# Patient Record
Sex: Male | Born: 1945
Health system: Southern US, Community
[De-identification: ages and names within clinical notes are randomized; demographics above are authoritative.]

## PROBLEM LIST (undated history)

## (undated) DIAGNOSIS — D499 Neoplasm of unspecified behavior of unspecified site: Secondary | ICD-10-CM

## (undated) DIAGNOSIS — I38 Endocarditis, valve unspecified: Secondary | ICD-10-CM

## (undated) DIAGNOSIS — I4729 Other ventricular tachycardia: Secondary | ICD-10-CM

## (undated) DIAGNOSIS — I471 Supraventricular tachycardia, unspecified: Secondary | ICD-10-CM

## (undated) DIAGNOSIS — R0789 Other chest pain: Secondary | ICD-10-CM

## (undated) DIAGNOSIS — R519 Headache, unspecified: Secondary | ICD-10-CM

## (undated) DIAGNOSIS — R51 Headache: Secondary | ICD-10-CM

## (undated) DIAGNOSIS — Z8659 Personal history of other mental and behavioral disorders: Secondary | ICD-10-CM

## (undated) DIAGNOSIS — I10 Essential (primary) hypertension: Secondary | ICD-10-CM

## (undated) DIAGNOSIS — L570 Actinic keratosis: Secondary | ICD-10-CM

## (undated) DIAGNOSIS — R011 Cardiac murmur, unspecified: Secondary | ICD-10-CM

## (undated) DIAGNOSIS — R002 Palpitations: Secondary | ICD-10-CM

## (undated) DIAGNOSIS — I472 Ventricular tachycardia: Secondary | ICD-10-CM

## (undated) HISTORY — DX: Personal history of other mental and behavioral disorders: Z86.59

## (undated) HISTORY — DX: Endocarditis, valve unspecified: I38

## (undated) HISTORY — DX: Cardiac murmur, unspecified: R01.1

## (undated) HISTORY — DX: Actinic keratosis: L57.0

## (undated) HISTORY — DX: Other ventricular tachycardia: I47.29

## (undated) HISTORY — DX: Palpitations: R00.2

## (undated) HISTORY — DX: Neoplasm of unspecified behavior of unspecified site: D49.9

## (undated) HISTORY — PX: VASECTOMY: SHX75

## (undated) HISTORY — DX: Supraventricular tachycardia: I47.1

## (undated) HISTORY — DX: Other chest pain: R07.89

## (undated) HISTORY — DX: Supraventricular tachycardia, unspecified: I47.10

## (undated) HISTORY — DX: Ventricular tachycardia: I47.2

## (undated) HISTORY — PX: EYE SURGERY: SHX253

---

## 2011-11-10 ENCOUNTER — Encounter: Payer: Self-pay | Admitting: Cardiovascular Disease

## 2011-11-12 ENCOUNTER — Ambulatory Visit (INDEPENDENT_AMBULATORY_CARE_PROVIDER_SITE_OTHER): Payer: BC Managed Care – HMO | Admitting: Cardiovascular Disease

## 2011-11-12 ENCOUNTER — Encounter: Payer: Self-pay | Admitting: Cardiovascular Disease

## 2011-11-12 DIAGNOSIS — I499 Cardiac arrhythmia, unspecified: Secondary | ICD-10-CM

## 2011-11-12 DIAGNOSIS — R002 Palpitations: Secondary | ICD-10-CM

## 2011-11-12 DIAGNOSIS — R079 Chest pain, unspecified: Secondary | ICD-10-CM

## 2011-11-12 DIAGNOSIS — F411 Generalized anxiety disorder: Secondary | ICD-10-CM

## 2011-11-12 DIAGNOSIS — F419 Anxiety disorder, unspecified: Secondary | ICD-10-CM

## 2011-11-12 MED ORDER — METOPROLOL SUCCINATE ER 25 MG PO TB24: 25.0000 mg | ORAL_TABLET | Freq: Every day | ORAL | Status: DC

## 2011-11-12 NOTE — Progress Notes (Signed)
    Andrew [name] Date of Birth  01-09-1946 [name]     [place] Office  [address]    [address]   [address] [place], [place]  [address]    [place], [place]  [address] [phone]  Fax  [phone]  [phone]  Fax [phone]   History of Present Illness:  Andrew [name] is a 66 yo who we are asked to see for palpitations.  He also has episodes of anxiety.  He has occasional episodes of chest pain for years.  These pains lasts for a minute or so.  The CP is a dull pain. No radiation. No dyspnea or diaphoresis.    Always at rest - never with exertion.  No regular exercise.    Current Outpatient Prescriptions  Medication Sig Dispense Refill  . aspirin 325 MG tablet Take 325 mg by mouth every other day.      . Multiple Vitamin (MULTI-VITAMIN DAILY) TABS Take by mouth.      . Omega-3 Fatty Acids (FISH OIL) 1000 MG CAPS Take 1,000 mg by mouth 2 (two) times daily before a meal.      . saw palmetto 160 MG capsule Take 160 mg by mouth daily.      . vitamin D, CHOLECALCIFEROL, 400 UNITS tablet Take 400 Units by mouth daily.         No Known Allergies  History reviewed. No pertinent past medical history.  Past Surgical History  Procedure Date  . Vasectomy     History  Smoking status  . Never Smoker   Smokeless tobacco  . Not on file    History  Alcohol Use  . 1.1 oz/week  . 1 Drinks containing 0.5 oz of alcohol, 1 Glasses of wine per week    Family History  Problem Relation Age of Onset  . Heart attack Father     Reviw of Systems:  Reviewed in the HPI.  All other systems are negative.  Physical Exam: Blood pressure 144/85, pulse 98, height 6\' 1"  (1.854 m), weight 151 lb (68.493 kg). General: Well developed, well nourished, in no acute distress.  Head: Normocephalic, atraumatic, sclera non-icteric, mucus membranes are moist,   Neck: Supple. Negative for carotid bruits. JVD not elevated.  Lungs: Clear bilaterally to auscultation without wheezes,  rales, or rhonchi. Breathing is unlabored.  Heart: RRR with S1 S2. Tachycardic. His PMI is hyperdynamic. Has a soft 1/6 murmur at the axilla.  Abdomen: Soft, non-tender, non-distended with normoactive bowel sounds. No hepatomegaly. No rebound/guarding. No obvious abdominal masses.  Msk:  Strength and tone appear normal for age.  Extremities: No clubbing or cyanosis. No edema.  Distal pedal pulses are 2+ and equal bilaterally.  Neuro: Alert and oriented X 3. Moves all extremities spontaneously.  Psych:  Responds to questions appropriately with a normal affect.  ECG: NSR.  No ST or T wave abnormalities.  Cholesterol is 171. HDL is 55. Triglyceride level is 50. LDL is 106.  Creatinine 0.96. Electrolytes are normal.   TSH is 0.42.   Hemoglobin is 15.2. Hematocrit 42.2.  Assessment / Plan:

## 2011-11-12 NOTE — Patient Instructions (Signed)
Your physician has recommended you make the following change in your medication: Start Metoprolol XL 25mg  ONCE daily.  Your physician has requested that you have an echocardiogram. Echocardiography is a painless test that uses sound waves to create images of your heart. It provides your doctor with information about the size and shape of your heart and how well your heart's chambers and valves are working. This procedure takes approximately one hour. There are no restrictions for this procedure.  Your physician recommends that you schedule a follow-up appointment in: 1 month

## 2011-11-12 NOTE — Assessment & Plan Note (Signed)
And presents with palpitations. These episodes occur at various times and associated with some anxiety. He is PMI is quite hyperdynamic. He has a soft murmur consistent with mitral regurgitation.  We will start him on metoprolol XL 25 mg a day. His TSH is normal.  We'll get an echocardiogram for further evaluation of his left ventricular function and valvular function. I will see him again in approximately 3-4 weeks.

## 2011-11-30 ENCOUNTER — Other Ambulatory Visit: Payer: Self-pay

## 2011-11-30 ENCOUNTER — Other Ambulatory Visit (INDEPENDENT_AMBULATORY_CARE_PROVIDER_SITE_OTHER): Payer: BC Managed Care – HMO

## 2011-11-30 DIAGNOSIS — I059 Rheumatic mitral valve disease, unspecified: Secondary | ICD-10-CM

## 2011-11-30 DIAGNOSIS — I499 Cardiac arrhythmia, unspecified: Secondary | ICD-10-CM

## 2011-12-01 ENCOUNTER — Encounter: Payer: Self-pay | Admitting: *Deleted

## 2011-12-02 ENCOUNTER — Telehealth: Payer: Self-pay | Admitting: Cardiovascular Disease

## 2011-12-02 NOTE — Telephone Encounter (Signed)
Patient aware of echo results.

## 2011-12-02 NOTE — Telephone Encounter (Signed)
Fu call °Patient returning your call °

## 2011-12-06 ENCOUNTER — Encounter: Payer: Self-pay | Admitting: *Deleted

## 2011-12-07 ENCOUNTER — Ambulatory Visit (INDEPENDENT_AMBULATORY_CARE_PROVIDER_SITE_OTHER): Payer: BC Managed Care – HMO | Admitting: Cardiovascular Disease

## 2011-12-07 ENCOUNTER — Encounter: Payer: Self-pay | Admitting: Cardiovascular Disease

## 2011-12-07 VITALS — BP 112/68 | HR 87 | Ht 73.0 in | Wt 153.4 lb

## 2011-12-07 DIAGNOSIS — R002 Palpitations: Secondary | ICD-10-CM

## 2011-12-07 NOTE — Assessment & Plan Note (Signed)
Andrew [name]'s palpitations have clearly improved on metoprolol 25 mg XL per day. I've given him the okay to take 50 mg a day if he thinks that would help him.  Is not having any episodes of angina. I told him that we could do a stress test if he has any problems but at this point I think that he needs to start a regular exercise program. He'll see [place] again in 6 months.

## 2011-12-07 NOTE — Patient Instructions (Signed)
Your physician wants you to follow-up in: 6 MONTHS WITH DR. NAHSER IN THE [place] OFFICE. You will receive a reminder letter in the mail two months in advance. If you don't receive a letter, please call  our office to schedule the follow-up appointment.  NO CHANGES TODAY 

## 2011-12-07 NOTE — Progress Notes (Signed)
Andrew [name] Date of Birth  1946-10-11 [facility]     [place] Office  [address]    [address]   [address] [place], [place]  [address]    [place], [place]  [address] [phone]  Fax  [phone]  [phone]  Fax [phone]   History of Present Illness:  Andrew [name] is a 66 yo who we are asked to see for palpitations.  He also has episodes of anxiety.  He has occasional episodes of chest pain for years.  These pains lasts for a minute or so.  The CP is a dull pain. No radiation. No dyspnea or diaphoresis.    Always at rest - never with exertion.  No regular exercise.    We started Metoprolol and he feels better.  Mild DOE ( pulling trees out of the woods) , no angina.  He still notices some occasional palpitations and anxiety later at night. We discussed the possibility of increasing his metoprolol to 50 mg a day.  An echocardiogram reveals normal left ventricular systolic function. He has mild mitral regurgitation and mild tricuspid regurgitation.   Current Outpatient Prescriptions  Medication Sig Dispense Refill  . aspirin 325 MG tablet Take 325 mg by mouth every other day.      . metoprolol succinate (TOPROL XL) 25 MG 24 hr tablet Take 1 tablet (25 mg total) by mouth daily.  30 tablet  6  . Multiple Vitamin (MULTI-VITAMIN DAILY) TABS Take by mouth.      . Omega-3 Fatty Acids (FISH OIL) 1000 MG CAPS Take 1,000 mg by mouth 2 (two) times daily before a meal.      . saw palmetto 160 MG capsule Take 160 mg by mouth daily.      . vitamin D, CHOLECALCIFEROL, 400 UNITS tablet Take 400 Units by mouth daily.         No Known Allergies  Past Medical History  Diagnosis Date  . Chest pain     always at rest never with exertion  . Anxiety     hx  . Palpitations     hx    Past Surgical History  Procedure Date  . Vasectomy     History  Smoking status  . Never Smoker   Smokeless tobacco  . Not on file    History  Alcohol Use  . 1.1 oz/week  . 1  Glasses of wine, 1 Drinks containing 0.5 oz of alcohol per week    Family History  Problem Relation Age of Onset  . Heart attack Father     Reviw of Systems:  Reviewed in the HPI.  All other systems are negative.  Physical Exam: Blood pressure 112/68, pulse 87, height 6\' 1"  (1.854 m), weight 153 lb 6.4 oz (69.582 kg). General: Well developed, well nourished, in no acute distress.  Head: Normocephalic, atraumatic, sclera non-icteric, mucus membranes are moist,   Neck: Supple. Negative for carotid bruits. JVD not elevated.  Lungs: Clear bilaterally to auscultation without wheezes, rales, or rhonchi. Breathing is unlabored.  Heart: RRR with S1 S2. Tachycardic. His PMI is hyperdynamic. Has a soft 1/6 murmur at the axilla.  Abdomen: Soft, non-tender, non-distended with normoactive bowel sounds. No hepatomegaly. No rebound/guarding. No obvious abdominal masses.  Msk:  Strength and tone appear normal for age.  Extremities: No clubbing or cyanosis. No edema.  Distal pedal pulses are 2+ and equal bilaterally.  Neuro: Alert and oriented X 3. Moves all extremities spontaneously.  Psych:  Responds to questions appropriately with a normal affect.  ECG:  Cholesterol is 171. HDL is 55. Triglyceride level is 50. LDL is 106.  Creatinine 0.96. Electrolytes are normal.   TSH is 0.42.   Hemoglobin is 15.2. Hematocrit 42.2.  Assessment / Plan:

## 2011-12-24 ENCOUNTER — Telehealth: Payer: Self-pay | Admitting: Cardiovascular Disease

## 2011-12-24 NOTE — Telephone Encounter (Signed)
C/o un easyness and occasional skip beats, will take extra metoprolol 25 mg prn as discuss on visit. He is gong out of town and will need more meds if this helps, would you want him to have inderal 10 mg for prn use? Please advise, pt understands he will not have a call back till 3/18 or 3/19.

## 2011-12-24 NOTE — Telephone Encounter (Signed)
New msg Pt is taking 25 mg of metoprolol. He wants to know if ok to double up on this med. He needs another rx if Dr Elease Hashimoto says to double it. [facility] [place] Please call to discuss

## 2011-12-27 MED ORDER — PROPRANOLOL HCL 10 MG PO TABS: 10.0000 mg | ORAL_TABLET | Freq: Four times a day (QID) | ORAL | Status: DC

## 2011-12-27 NOTE — Telephone Encounter (Signed)
OK to try Inderal 10 mg as needed in addition to his metoprolol

## 2011-12-27 NOTE — Telephone Encounter (Signed)
Pt contacted,medication teaching done. Agreed to plan.

## 2012-01-05 ENCOUNTER — Telehealth: Payer: Self-pay | Admitting: Cardiovascular Disease

## 2012-01-05 NOTE — Telephone Encounter (Signed)
lmtcb

## 2012-01-05 NOTE — Telephone Encounter (Signed)
msg left/ no answer, i will call back.

## 2012-01-05 NOTE — Telephone Encounter (Signed)
Please return call to patient regarding medication instructions, he can be reached at [phone].

## 2012-01-06 NOTE — Telephone Encounter (Signed)
Pt rtn call form yesterday, left work number and not number put on message so he didn't get it until he got home, needs to be called at [phone], pls call

## 2012-01-06 NOTE — Telephone Encounter (Signed)
Pt continues to have palpitation problems, describes anxiety and fuzzy head feelings at times. Using propranolol and states he gets dizzy post. Bp is in 150/88 range, asked him to take bp and pulse when he feels that way and prior to taking propranolol and to keep record and bring to ov in [place], app set. Describes sore muscle feeling left chest as stated prior at ov. Told to call with further questions or concerns and pt agreed to plan.

## 2012-02-02 ENCOUNTER — Ambulatory Visit (INDEPENDENT_AMBULATORY_CARE_PROVIDER_SITE_OTHER): Payer: BC Managed Care – HMO | Admitting: Cardiovascular Disease

## 2012-02-02 ENCOUNTER — Other Ambulatory Visit: Payer: Self-pay | Admitting: *Deleted

## 2012-02-02 ENCOUNTER — Encounter: Payer: Self-pay | Admitting: Cardiovascular Disease

## 2012-02-02 VITALS — BP 110/68 | HR 68 | Ht 73.0 in | Wt 154.2 lb

## 2012-02-02 DIAGNOSIS — R002 Palpitations: Secondary | ICD-10-CM

## 2012-02-02 MED ORDER — METOPROLOL SUCCINATE ER 25 MG PO TB24: 25.0000 mg | ORAL_TABLET | Freq: Every day | ORAL | Status: DC

## 2012-02-02 NOTE — Patient Instructions (Signed)
Follow up with Dr. Elease Hashimoto in 4 months.

## 2012-02-02 NOTE — Progress Notes (Signed)
Andrew [name] Date of Birth  [birth year] [facility]     [place] Office  [address]    [address]   [address] [place], [place]  [address]    [place], [place]  [address] [phone]  Fax  [phone]  [phone]  Fax [phone]  Problem list: 1. Sinus tachycardia 2. Premature ventricular contractions 3. Mitral regurgitation - mild  History of Present Illness:  Andrew [name] is a 66 yo who we follow for palpitations. He had sinus tachycardia when I originally met him. He is done well on metoprolol XL.  We also gave him some propranolol for premature ventricular contractions. This caused him to have lightheadedness and also a "fuzzy feeling".  The ceiling quite a bit better. He's not having episodes of chest pain or shortness breath. He's able to do all of his normal activities including mowing the lawn with a push more without any difficulty.  He's had labs drawn by his general medical Dr. His TSH was found to be at the lower and of normal. He has a strong family history of thyroid problems.   Current Outpatient Prescriptions  Medication Sig Dispense Refill  . aspirin 325 MG tablet Take 325 mg by mouth every other day.      . metoprolol succinate (TOPROL XL) 25 MG 24 hr tablet Take 1 tablet (25 mg total) by mouth daily.  30 tablet  6  . Multiple Vitamin (MULTI-VITAMIN DAILY) TABS Take by mouth.      . Omega-3 Fatty Acids (FISH OIL) 1000 MG CAPS Take 1,000 mg by mouth 2 (two) times daily before a meal.      . saw palmetto 160 MG capsule Take 160 mg by mouth daily.      . vitamin D, CHOLECALCIFEROL, 400 UNITS tablet Take 1,000 Units by mouth daily.       . propranolol (INDERAL) 10 MG tablet Take 1 tablet (10 mg total) by mouth 4 (four) times daily. For palpitations, 30 minutes apart.  50 tablet  1     No Known Allergies  Past Medical History  Diagnosis Date  . Chest pain     always at rest never with exertion  . Anxiety     hx  . Palpitations     hx     Past Surgical History  Procedure Date  . Vasectomy     History  Smoking status  . Never Smoker   Smokeless tobacco  . Not on file    History  Alcohol Use  . 1.1 oz/week  . 1 Glasses of wine, 1 Drinks containing 0.5 oz of alcohol per week    Family History  Problem Relation Age of Onset  . Heart attack Father     Reviw of Systems:  Reviewed in the HPI.  All other systems are negative.  Physical Exam: Blood pressure 110/68, pulse 68, height 6\' 1"  (1.854 m), weight 154 lb 4 oz (69.967 kg). General: Well developed, well nourished, in no acute distress.  Head: Normocephalic, atraumatic, sclera non-icteric, mucus membranes are moist,   Neck: Supple. Negative for carotid bruits. JVD not elevated.  Lungs: Clear bilaterally to auscultation without wheezes, rales, or rhonchi. Breathing is unlabored.  Heart: RRR with S1 S2.  His PMI is hyperdynamic. Has a soft 1/6 murmur at the axilla.  Abdomen: Soft, non-tender, non-distended with normoactive bowel sounds. No hepatomegaly. No rebound/guarding. No obvious abdominal masses.  Msk:  Strength and tone appear normal for age.  Extremities: No  clubbing or cyanosis. No edema.  Distal pedal pulses are 2+ and equal bilaterally.  Neuro: Alert and oriented X 3. Moves all extremities spontaneously.  Psych:  Responds to questions appropriately with a normal affect.  ECG:  Labs from January, 2013 Cholesterol is 171. HDL is 55. Triglyceride level is 50. LDL is 106.  Creatinine 0.96. Electrolytes are normal.   TSH is 0.42.   Hemoglobin is 15.2. Hematocrit 42.2.  Assessment / Plan:

## 2012-02-02 NOTE — Assessment & Plan Note (Signed)
Andrew [name] is doing fairly well. At this point he seems to be doing quite well on the Toprol. He did not have a good response to the propranolol. We will have him stay off the propranolol for now. He'll keep his appointment in 4-5 months.

## 2012-06-28 ENCOUNTER — Ambulatory Visit (INDEPENDENT_AMBULATORY_CARE_PROVIDER_SITE_OTHER): Payer: BC Managed Care – HMO | Admitting: Cardiovascular Disease

## 2012-06-28 ENCOUNTER — Encounter: Payer: Self-pay | Admitting: Cardiovascular Disease

## 2012-06-28 VITALS — BP 132/78 | HR 80 | Ht 73.0 in | Wt 156.2 lb

## 2012-06-28 DIAGNOSIS — R002 Palpitations: Secondary | ICD-10-CM

## 2012-06-28 NOTE — Assessment & Plan Note (Signed)
[name] seems to be doing well.  His palpitations have been better since being on the Toprol XL.  I have encouraged him to walk on a regular basis.  Will see him again in 6 months.

## 2012-06-28 NOTE — Progress Notes (Signed)
Andrew [name] Date of Birth  Mar 04, 1946 [facility]      Office  [address]    [address]   [address] [place], [place]  [address]    [place], [place]  [address] [phone]  Fax  [phone]  [phone]  Fax [phone]  Problem list: 1. Sinus tachycardia 2. Premature ventricular contractions 3. Mitral regurgitation - mild  History of Present Illness:  Andrew [name] is a 66 yo who we follow for palpitations. He had sinus tachycardia when I originally met him. He is done well on metoprolol XL.  We also gave him some propranolol for premature ventricular contractions. This caused him to have lightheadedness and also a "fuzzy feeling".  His episodes of lightheadedness  significantly since he started drinking V8 juice every day or so.  He is feeling quite a bit better. He's not having episodes of chest pain or shortness breath. He's able to do all of his normal activities including mowing the lawn with a push more without any difficulty.  He has noted some mild chest discomfort but these are usually associated with mental stress situations.  He typically feels better when is active ( walking or mowing the grass)    Current Outpatient Prescriptions  Medication Sig Dispense Refill  . aspirin 325 MG tablet Take 325 mg by mouth every other day.      . cholecalciferol (VITAMIN D) 1000 UNITS tablet Take 1,000 Units by mouth daily.      . metoprolol succinate (TOPROL XL) 25 MG 24 hr tablet Take 1 tablet (25 mg total) by mouth daily.  90 tablet  3  . Multiple Vitamin (MULTI-VITAMIN DAILY) TABS Take by mouth.      . Omega-3 Fatty Acids (FISH OIL) 1000 MG CAPS Take 1,000 mg by mouth 2 (two) times daily before a meal.      . saw palmetto 160 MG capsule Take 160 mg by mouth daily.         No Known Allergies  Past Medical History  Diagnosis Date  . Chest pain     always at rest never with exertion  . Anxiety     hx  . Palpitations     hx    Past Surgical  History  Procedure Date  . Vasectomy     History  Smoking status  . Never Smoker   Smokeless tobacco  . Not on file    History  Alcohol Use  . 1.1 oz/week  . 1 Glasses of wine, 1 Drinks containing 0.5 oz of alcohol per week    Family History  Problem Relation Age of Onset  . Heart attack Father     Reviw of Systems:  Reviewed in the HPI.  All other systems are negative.  Physical Exam: Blood pressure 132/78, pulse 80, height 6\' 1"  (1.854 m), weight 156 lb 4 oz (70.875 kg). General: Well developed, well nourished, in no acute distress.  Head: Normocephalic, atraumatic, sclera non-icteric, mucus membranes are moist,   Neck: Supple. Negative for carotid bruits. JVD normal.  Lungs: Clear bilaterally to auscultation. Breathing is normal.  Heart: RRR with S1 S2.  His PMI is mildly  hyperdynamic. Has a soft 1/6 murmur at the axilla.  Abdomen: Soft, non-tender, non-distended with normoactive bowel sounds. No hepatomegaly. No rebound/guarding. No obvious abdominal masses.  Msk:  Strength and tone appear normal for age.  Extremities: No clubbing or cyanosis. No edema.  Distal pedal pulses are 2+ and equal bilaterally.  Neuro: Alert and oriented X 3. Moves all extremities spontaneously.  Psych:  Responds to questions appropriately with a normal affect.  ECG:  Sept. 17, 2013 - NSR at [employer] RBBB. LAE  Labs from January, 2013 Cholesterol is 171. HDL is 55. Triglyceride level is 50. LDL is 106.  Creatinine 0.96. Electrolytes are normal.  TSH is 0.42.   Hemoglobin is 15.2. Hematocrit 42.2.   Assessment / Plan:

## 2012-06-28 NOTE — Patient Instructions (Addendum)
Your physician wants you to follow-up in: 6 months with Dr. Nahser. You will receive a reminder letter in the mail two months in advance. If you don't receive a letter, please call our office to schedule the follow-up appointment.  

## 2012-11-25 ENCOUNTER — Other Ambulatory Visit: Payer: Self-pay

## 2012-12-26 ENCOUNTER — Encounter: Payer: Self-pay | Admitting: Cardiovascular Disease

## 2012-12-26 ENCOUNTER — Ambulatory Visit (INDEPENDENT_AMBULATORY_CARE_PROVIDER_SITE_OTHER): Payer: BC Managed Care – HMO | Admitting: Cardiovascular Disease

## 2012-12-26 VITALS — BP 130/72 | HR 84 | Ht 73.0 in | Wt 160.8 lb

## 2012-12-26 DIAGNOSIS — R002 Palpitations: Secondary | ICD-10-CM

## 2012-12-26 DIAGNOSIS — R079 Chest pain, unspecified: Secondary | ICD-10-CM

## 2012-12-26 NOTE — Progress Notes (Signed)
Philomena Course Date of Birth  1946/04/22 [facility]     [place] Office  [address]    [address]   [address] [place], [place]  [address]    [place], [place]  [address] [phone]  Fax  [phone]  [phone]  Fax [phone]  Problem list: 1. Sinus tachycardia 2. Premature ventricular contractions 3. 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 He had sinus tachycardia when I originally met him. He is done well on metoprolol XL.  We also gave him some propranolol for premature ventricular contractions. This caused him to have lightheadedness and also a "fuzzy feeling".  His episodes of lightheadedness  significantly since he started drinking V8 juice every day or so.  He is feeling quite a bit better. He's not having episodes of chest pain or shortness breath. He's able to do all of his normal activities including mowing the lawn with a push more without any difficulty.  He has noted some mild chest discomfort but these are usually associated with mental stress situations.  He typically feels better when is active ( walking or mowing the grass)  December 26, 2012: He is doing well. Tolerating the Toprol XL.    Current Outpatient Prescriptions  Medication Sig Dispense Refill  . aspirin 325 MG tablet Take 325 mg by mouth every other day.      . cholecalciferol (VITAMIN D) 1000 UNITS tablet Take 1,000 Units by mouth daily.      . metoprolol succinate (TOPROL XL) 25 MG 24 hr tablet Take 1 tablet (25 mg total) by mouth daily.  90 tablet  3  . Multiple Vitamin (MULTI-VITAMIN DAILY) TABS Take by mouth.      . Omega-3 Fatty Acids (FISH OIL) 1000 MG CAPS Take 1,000 mg by mouth 2 (two) times daily before a meal.      . saw palmetto 160 MG capsule Take 160 mg by mouth every other day.        No current facility-administered medications for this visit.     No Known Allergies  Past Medical History   Diagnosis Date  . Chest pain     always at rest never with exertion  . Anxiety     hx  . Palpitations     hx    Past Surgical History  Procedure Laterality Date  . Vasectomy      History  Smoking status  . Never Smoker   Smokeless tobacco  . Not on file    History  Alcohol Use  . 1.1 oz/week  . 1 Glasses of wine, 1 Drinks containing 0.5 oz of alcohol per week    Family History  Problem Relation Age of Onset  . Heart attack Father     Reviw of Systems:  Reviewed in the HPI.  All other systems are negative.  Physical Exam: Blood pressure 130/72, pulse 84, height 6\' 1"  (1.854 m), weight 160 lb 12 oz (72.916 kg). General: Well developed, well nourished, in no acute distress.  Head: Normocephalic, atraumatic, sclera non-icteric, mucus membranes are moist,   Neck: Supple. Negative for carotid bruits. JVD normal.  Lungs: Clear bilaterally to auscultation. Breathing is normal.  Heart: RRR with S1 S2.  His PMI is mildly  hyperdynamic. Has a soft 1/6 murmur at the axilla.  Abdomen: Soft, non-tender, non-distended with normoactive bowel sounds. No hepatomegaly. No rebound/guarding. No obvious abdominal masses.  Msk:  Strength and tone appear normal for age.  Extremities: No clubbing or cyanosis. No edema.  Distal pedal pulses are 2+ and equal bilaterally.  Neuro: Alert and oriented X 3. Moves all extremities spontaneously.  Psych:  Responds to questions appropriately with a normal affect.  ECG:  December 26, 2012:  [employer] RBBB. No ST or T wave changes.   Labs from January, 2013 Cholesterol is 171. HDL is 55. Triglyceride level is 50. LDL is 106.  Creatinine 0.96. Electrolytes are normal.  TSH is 0.42.   Hemoglobin is 15.2. Hematocrit 42.2.   Assessment / Plan:

## 2012-12-26 NOTE — Patient Instructions (Addendum)
Follow up with Dr. Elease Hashimoto in 6 months.  Continue with current medications. Call if you have any further questions or concerns.

## 2012-12-26 NOTE — Assessment & Plan Note (Signed)
[name] is doing well.  He is not having any significant angina.  He does have some very brief chest twinges in the evenings.  These are never associated with exertion.  They only last for a second.  I do not think they are due to coronary artery disease.   I will see him again in 6 months.  We will get fasting labs in 1 year.

## 2013-01-26 ENCOUNTER — Other Ambulatory Visit: Payer: Self-pay | Admitting: *Deleted

## 2013-01-26 MED ORDER — METOPROLOL SUCCINATE ER 25 MG PO TB24: 25.0000 mg | ORAL_TABLET | Freq: Every day | ORAL | Status: DC

## 2013-01-26 NOTE — Telephone Encounter (Signed)
Fax Received. Refill Completed. Andrew [name] (R.M.A)   

## 2013-04-27 ENCOUNTER — Telehealth: Payer: Self-pay | Admitting: *Deleted

## 2013-04-27 NOTE — Telephone Encounter (Signed)
Pt aware he does not need fasting lab work again until next year as per Dr. Elease Hashimoto office note in March Mylo Red RN

## 2013-04-27 NOTE — Telephone Encounter (Signed)
Needs bloodwork with his PCP is seeing Dr. Janece Canterbury in October is there any labs he would want done, please advise patient

## 2013-05-16 ENCOUNTER — Other Ambulatory Visit: Payer: Self-pay

## 2013-07-17 ENCOUNTER — Ambulatory Visit (INDEPENDENT_AMBULATORY_CARE_PROVIDER_SITE_OTHER): Payer: Medicare Other | Admitting: Cardiovascular Disease

## 2013-07-17 ENCOUNTER — Encounter: Payer: Self-pay | Admitting: Cardiovascular Disease

## 2013-07-17 VITALS — BP 120/86 | HR 85 | Ht 73.0 in | Wt 163.8 lb

## 2013-07-17 DIAGNOSIS — R002 Palpitations: Secondary | ICD-10-CM

## 2013-07-17 DIAGNOSIS — R079 Chest pain, unspecified: Secondary | ICD-10-CM

## 2013-07-17 DIAGNOSIS — R0789 Other chest pain: Secondary | ICD-10-CM | POA: Insufficient documentation

## 2013-07-17 NOTE — Progress Notes (Signed)
Andrew [name] Date of Birth  1946/02/22 [name]     [name]  [address]    [address]   [address] [name], [place]  [address]    [place], [place]  [address] [phone]  Fax  [phone]  [phone]  Fax [phone]  Problem list: 1. Sinus tachycardia 2. Premature ventricular contractions 3. 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 He had sinus tachycardia when I originally met him. He is done well on metoprolol XL.  We also gave him some propranolol for premature ventricular contractions. This caused him to have lightheadedness and also a "fuzzy feeling".  His episodes of lightheadedness  significantly since he started drinking V8 juice every day or so.  He is feeling quite a bit better. He's not having episodes of chest pain or shortness breath. He's able to do all of his normal activities including mowing the lawn with a push more without any difficulty.  He has noted some mild chest discomfort but these are usually associated with mental stress situations.  He typically feels better when is active ( walking or mowing the grass)  December 26, 2012: He is doing well. Tolerating the Toprol XL.  Oct. 7, 2014:  Andrew [name] is doing well.  He does have some chest pain.    The pains are the same that he has had everytime that he is here.  The pains are dull, random, not associated with exertion.   Similar to a muscle ache.     Current Outpatient Prescriptions  Medication Sig Dispense Refill  . aspirin 325 MG tablet Take 325 mg by mouth every other day.      . cholecalciferol (VITAMIN D) 1000 UNITS tablet Take 1,000 Units by mouth daily.      . metoprolol succinate (TOPROL XL) 25 MG 24 hr tablet Take 1 tablet (25 mg total) by mouth daily.  90 tablet  3  . Multiple Vitamin (MULTI-VITAMIN DAILY) TABS Take by mouth.      . Omega-3 Fatty Acids (FISH OIL) 1000 MG CAPS Take 1,000 mg by mouth 2  (two) times daily before a meal.      . saw palmetto 160 MG capsule Take 160 mg by mouth every other day.        No current facility-administered medications for this visit.     No Known Allergies  Past Medical History  Diagnosis Date  . Chest pain     always at rest never with exertion  . Anxiety     hx  . Palpitations     hx    Past Surgical History  Procedure Laterality Date  . Vasectomy      History  Smoking status  . Never Smoker   Smokeless tobacco  . Not on file    History  Alcohol Use  . 1.1 oz/week  . 1 Glasses of wine, 1 Drinks containing 0.5 oz of alcohol per week    Family History  Problem Relation Age of Onset  . Heart attack Father     Reviw of Systems:  Reviewed in the HPI.  All other systems are negative.  Physical Exam: Blood pressure 120/86, pulse 85, height 6\' 1"  (1.854 m), weight 163 lb 12 oz (74.277 kg). General: Well developed, well nourished, in no acute distress.  Head: Normocephalic, atraumatic, sclera non-icteric, mucus membranes are moist,   Neck: Supple. Negative for carotid  bruits. JVD normal.  Lungs: Clear bilaterally to auscultation. Breathing is normal.  Heart: RRR with S1 S2.  His PMI is mildly  hyperdynamic. Has a soft 1/6 murmur at the axilla.  Abdomen: Soft, non-tender, non-distended with normoactive bowel sounds. No hepatomegaly. No rebound/guarding. No obvious abdominal masses.  Msk:  Strength and tone appear normal for age.  Extremities: No clubbing or cyanosis. No edema.  Distal pedal pulses are 2+ and equal bilaterally.  Neuro: Alert and oriented X 3. Moves all extremities spontaneously.  Psych:  Responds to questions appropriately with a normal affect.  ECG:  December 26, 2012:  [employer] RBBB. No ST or T wave changes.   Labs from January, 2013 Cholesterol is 171. HDL is 55. Triglyceride level is 50. LDL is 106.  Creatinine 0.96. Electrolytes are normal.  TSH is 0.42.   Hemoglobin is 15.2. Hematocrit  42.2.   Assessment / Plan:

## 2013-07-17 NOTE — Assessment & Plan Note (Signed)
He is tolerating the metoprolol well.

## 2013-07-17 NOTE — Patient Instructions (Addendum)
Your physician wants you to follow-up in: 1 year. You will receive a reminder letter in the mail two months in advance. If you don't receive a letter, please call our office to schedule the follow-up appointment.  

## 2013-07-17 NOTE — Assessment & Plan Note (Signed)
He has continued to have atypical chest pain.  These pains typically occur at rest - never with exertion.  His resting ECG is normal.  He feels better after exercising.     We discussed doing a stress echo.  He is not interested in doing a stress echo at this time.  From his hx, I do not think these pains are from a cardiac etiology.

## 2013-08-01 DIAGNOSIS — D239 Other benign neoplasm of skin, unspecified: Secondary | ICD-10-CM | POA: Diagnosis not present

## 2013-08-01 DIAGNOSIS — L570 Actinic keratosis: Secondary | ICD-10-CM | POA: Diagnosis not present

## 2013-08-01 DIAGNOSIS — L821 Other seborrheic keratosis: Secondary | ICD-10-CM | POA: Diagnosis not present

## 2013-08-16 ENCOUNTER — Other Ambulatory Visit: Payer: Self-pay

## 2013-08-28 DIAGNOSIS — H43819 Vitreous degeneration, unspecified eye: Secondary | ICD-10-CM | POA: Diagnosis not present

## 2013-08-28 DIAGNOSIS — H2510 Age-related nuclear cataract, unspecified eye: Secondary | ICD-10-CM | POA: Diagnosis not present

## 2013-09-19 DIAGNOSIS — L821 Other seborrheic keratosis: Secondary | ICD-10-CM | POA: Diagnosis not present

## 2013-09-19 DIAGNOSIS — L570 Actinic keratosis: Secondary | ICD-10-CM | POA: Diagnosis not present

## 2014-01-17 ENCOUNTER — Other Ambulatory Visit: Payer: Self-pay | Admitting: Cardiovascular Disease

## 2014-03-14 DIAGNOSIS — T148 Other injury of unspecified body region: Secondary | ICD-10-CM | POA: Diagnosis not present

## 2014-03-14 DIAGNOSIS — W57XXXA Bitten or stung by nonvenomous insect and other nonvenomous arthropods, initial encounter: Secondary | ICD-10-CM | POA: Diagnosis not present

## 2014-03-14 DIAGNOSIS — L570 Actinic keratosis: Secondary | ICD-10-CM | POA: Diagnosis not present

## 2014-03-14 DIAGNOSIS — L821 Other seborrheic keratosis: Secondary | ICD-10-CM | POA: Diagnosis not present

## 2014-03-14 DIAGNOSIS — D485 Neoplasm of uncertain behavior of skin: Secondary | ICD-10-CM | POA: Diagnosis not present

## 2014-07-19 ENCOUNTER — Ambulatory Visit (INDEPENDENT_AMBULATORY_CARE_PROVIDER_SITE_OTHER): Payer: Medicare Other | Admitting: Cardiovascular Disease

## 2014-07-19 ENCOUNTER — Encounter: Payer: Self-pay | Admitting: Cardiovascular Disease

## 2014-07-19 VITALS — BP 130/70 | HR 86 | Ht 73.0 in | Wt 170.0 lb

## 2014-07-19 DIAGNOSIS — R0789 Other chest pain: Secondary | ICD-10-CM

## 2014-07-19 DIAGNOSIS — R002 Palpitations: Secondary | ICD-10-CM

## 2014-07-19 MED ORDER — ASPIRIN EC 81 MG PO TBEC: 81.0000 mg | DELAYED_RELEASE_TABLET | Freq: Every day | ORAL | Status: DC

## 2014-07-19 NOTE — Assessment & Plan Note (Signed)
Atypical Cp stABLE

## 2014-07-19 NOTE — Patient Instructions (Signed)
Please decrease your aspirin to 81 mg daily  Your physician wants you to follow-up in: 1 year.  You will receive a reminder letter in the mail two months in advance.  If you don't receive a letter, please call our office to schedule the follow-up appointment.  Your next appointment will be scheduled in our new office located at :  [place]  [address], [place]  [place], [place] [address]

## 2014-07-19 NOTE — Assessment & Plan Note (Signed)
We discussed his heart rate. His heart rate tends to run low but fast although he states it runs well at home. I suggested that we increase his Toprol-XL to 50 mg a day. He was quite resistant to this and wants to stay at  25 mg a day. We will have him keep the heart rate and blood pressure log and he will  bring it to his accident.

## 2014-07-19 NOTE — Progress Notes (Signed)
Andrew [name] Date of Birth  Apr 23, 1946 [place] [address]    [place]   [place], [place]  [address]    [place], [place]  [address] [phone]  Fax  [phone]  [phone]  Fax [phone]  Problem list: 1. Sinus tachycardia 2. Premature ventricular contractions 3. 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 He had sinus tachycardia when I originally met him. He is done well on metoprolol XL.  We also gave him some propranolol for premature ventricular contractions. This caused him to have lightheadedness and also a "fuzzy feeling".  His episodes of lightheadedness  significantly since he started drinking V8 juice every day or so.  He is feeling quite a bit better. He's not having episodes of chest pain or shortness breath. He's able to do all of his normal activities including mowing the lawn with a push more without any difficulty.  He has noted some mild chest discomfort but these are usually associated with mental stress situations.  He typically feels better when is active ( walking or mowing the grass)  December 26, 2012: He is doing well. Tolerating the Toprol XL.  Oct. 7, 2014:  [name] is doing well.  He does have some chest pain.    The pains are the same that he has had everytime that he is here.  The pains are dull, random, not associated with exertion.   Similar to a muscle ache.    Oct. 9, 2015:  [name] is doing well.  Was seen with his wife , Andrew [name].    BP and HR are stable Has started walking.   Still working on cars  - no changes in his chest twinges.     Current Outpatient Prescriptions  Medication Sig Dispense Refill  . aspirin 325 MG tablet Take 325 mg by mouth every other day.      . cholecalciferol (VITAMIN D) 1000 UNITS tablet Take 1,000 Units by mouth daily.      . metoprolol succinate (TOPROL-XL) 25 MG 24 hr tablet TAKE 1 TABLET BY  MOUTH EVERY DAY  90 tablet  3  . Multiple Vitamin (MULTI-VITAMIN DAILY) TABS Take by mouth.      . Omega-3 Fatty Acids (FISH OIL) 1000 MG CAPS Take 1,000 mg by mouth 2 (two) times daily before a meal.      . saw palmetto 160 MG capsule Take 160 mg by mouth every other day.        No current facility-administered medications for this visit.     No Known Allergies  Past Medical History  Diagnosis Date  . Chest pain     always at rest never with exertion  . Anxiety     hx  . Palpitations     hx    Past Surgical History  Procedure Laterality Date  . Vasectomy      History  Smoking status  . Never Smoker   Smokeless tobacco  . Not on file    History  Alcohol Use  . 1.1 oz/week  . 1 Glasses of wine, 1 Drinks containing 0.5 oz of alcohol per week    Family History  Problem Relation Age of Onset  . Heart attack Father     Reviw of Systems:  Reviewed in the HPI.  All other systems are negative.  Physical Exam: Blood pressure  130/70, pulse 86, height '6\' 1"'  (1.854 m), weight 170 lb (77.111 kg). General: Well developed, well nourished, in no acute distress.  Head: Normocephalic, atraumatic, sclera non-icteric, mucus membranes are moist,   Neck: Supple. Negative for carotid bruits. JVD normal.  Lungs: Clear bilaterally to auscultation. Breathing is normal.  Heart: RRR with S1 S2.  His PMI is mildly  hyperdynamic. Has a soft 1/6 murmur at the axilla.  Abdomen: Soft, non-tender, non-distended with normoactive bowel sounds. No hepatomegaly. No rebound/guarding. No obvious abdominal masses.  Msk:  Strength and tone appear normal for age.  Extremities: No clubbing or cyanosis. No edema.  Distal pedal pulses are 2+ and equal bilaterally.  Neuro: Alert and oriented X 3. Moves all extremities spontaneously.  Psych:  Responds to questions appropriately with a normal affect.  ECG: 07/19/2014: Normal sinus rhythm at 81. He has no ST or T wave changes.  Assessment /  Plan:

## 2014-07-25 ENCOUNTER — Encounter: Payer: Self-pay | Admitting: Cardiovascular Disease

## 2014-09-16 DIAGNOSIS — L578 Other skin changes due to chronic exposure to nonionizing radiation: Secondary | ICD-10-CM | POA: Diagnosis not present

## 2014-09-16 DIAGNOSIS — L219 Seborrheic dermatitis, unspecified: Secondary | ICD-10-CM | POA: Diagnosis not present

## 2014-09-16 DIAGNOSIS — L814 Other melanin hyperpigmentation: Secondary | ICD-10-CM | POA: Diagnosis not present

## 2014-09-16 DIAGNOSIS — L820 Inflamed seborrheic keratosis: Secondary | ICD-10-CM | POA: Diagnosis not present

## 2014-09-16 DIAGNOSIS — Z1283 Encounter for screening for malignant neoplasm of skin: Secondary | ICD-10-CM | POA: Diagnosis not present

## 2014-09-16 DIAGNOSIS — L570 Actinic keratosis: Secondary | ICD-10-CM | POA: Diagnosis not present

## 2014-09-16 DIAGNOSIS — D229 Melanocytic nevi, unspecified: Secondary | ICD-10-CM | POA: Diagnosis not present

## 2014-09-16 DIAGNOSIS — L821 Other seborrheic keratosis: Secondary | ICD-10-CM | POA: Diagnosis not present

## 2015-01-01 ENCOUNTER — Other Ambulatory Visit: Payer: Self-pay | Admitting: Cardiovascular Disease

## 2015-03-24 DIAGNOSIS — L570 Actinic keratosis: Secondary | ICD-10-CM | POA: Diagnosis not present

## 2015-03-24 DIAGNOSIS — D229 Melanocytic nevi, unspecified: Secondary | ICD-10-CM | POA: Diagnosis not present

## 2015-03-24 DIAGNOSIS — L821 Other seborrheic keratosis: Secondary | ICD-10-CM | POA: Diagnosis not present

## 2015-03-24 DIAGNOSIS — L820 Inflamed seborrheic keratosis: Secondary | ICD-10-CM | POA: Diagnosis not present

## 2015-07-21 ENCOUNTER — Encounter: Payer: Self-pay | Admitting: Cardiovascular Disease

## 2015-07-21 ENCOUNTER — Ambulatory Visit (INDEPENDENT_AMBULATORY_CARE_PROVIDER_SITE_OTHER): Payer: Medicare Other | Admitting: Cardiovascular Disease

## 2015-07-21 VITALS — BP 120/76 | HR 77 | Ht 73.0 in | Wt 171.5 lb

## 2015-07-21 DIAGNOSIS — R002 Palpitations: Secondary | ICD-10-CM

## 2015-07-21 DIAGNOSIS — R0789 Other chest pain: Secondary | ICD-10-CM

## 2015-07-21 NOTE — Progress Notes (Signed)
Andrew [name] Date of Birth  12-08-1945 [facility] [address]    [place]   [place], [place]  [address]    [place], [place]  [address] [phone]  Fax  [phone]  [phone]  Fax [phone]  Problem list: 1. Sinus tachycardia 2. Premature ventricular contractions 3. 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 He had sinus tachycardia when I originally met him. He is done well on metoprolol XL.  We also gave him some propranolol for premature ventricular contractions. This caused him to have lightheadedness and also a "fuzzy feeling".  His episodes of lightheadedness  significantly since he started drinking V8 juice every day or so.  He is feeling quite a bit better. He's not having episodes of chest pain or shortness breath. He's able to do all of his normal activities including mowing the lawn with a push more without any difficulty.  He has noted some mild chest discomfort but these are usually associated with mental stress situations.  He typically feels better when is active ( walking or mowing the grass)  December 26, 2012: He is doing well. Tolerating the Toprol XL.  Oct. 7, 2014:  Andrew [name] is doing well.  He does have some chest pain.    The pains are the same that he has had everytime that he is here.  The pains are dull, random, not associated with exertion.   Similar to a muscle ache.    Oct. 9, 2015:  Andrew [name] is doing well.  Was seen with his wife , Andrew [name].    BP and HR are stable Has started walking.   Still working on cars  - no changes in his chest twinges.  Oct. 10, 2016:  Andrew [name] is doing well.  Still occasional CP .  Walks on occasion . Perhaps not as much as before Looking forward to walking more during this fall weather.  Goes kayaking frequently .    Current Outpatient Prescriptions  Medication Sig Dispense Refill  . aspirin EC 81 MG tablet  Take 1 tablet (81 mg total) by mouth daily. 90 tablet 3  . cholecalciferol (VITAMIN D) 1000 UNITS tablet Take 1,000 Units by mouth daily.    . magnesium oxide (MAG-OX) 400 MG tablet Take 400 mg by mouth every other day.    . metoprolol succinate (TOPROL-XL) 25 MG 24 hr tablet TAKE 1 TABLET BY MOUTH EVERY DAY 90 tablet 3  . Multiple Vitamin (MULTI-VITAMIN DAILY) TABS Take by mouth.    . saw palmetto 160 MG capsule Take 160 mg by mouth every other day.      No current facility-administered medications for this visit.     No Known Allergies  Past Medical History  Diagnosis Date  . Chest pain     always at rest never with exertion  . Anxiety     hx  . Palpitations     hx    Past Surgical History  Procedure Laterality Date  . Vasectomy      History  Smoking status  . Never Smoker   Smokeless tobacco  . Not on file    History  Alcohol Use  . 1.1 oz/week  . 1 Glasses of wine, 1 Standard drinks or equivalent per week    Family History  Problem Relation Age of Onset  . Heart attack Father  Reviw of Systems:  Reviewed in the HPI.  All other systems are negative.  Physical Exam: Blood pressure 120/76, pulse 77, height _0  (1.854 m), weight 77.792 kg (171 lb 8 oz). General: Well developed, well nourished, in no acute distress.  Head: Normocephalic, atraumatic, sclera non-icteric, mucus membranes are moist,   Neck: Supple. Negative for carotid bruits. JVD normal.  Lungs: Clear bilaterally to auscultation. Breathing is normal.  Heart: RRR with S1 S2.  His PMI is mildly  hyperdynamic. Has a soft 1/6 murmur at the axilla.  Abdomen: Soft, non-tender, non-distended with normoactive bowel sounds. No hepatomegaly. No rebound/guarding. No obvious abdominal masses.  Msk:  Strength and tone appear normal for age.  Extremities: No clubbing or cyanosis. No edema.  Distal pedal pulses are 2+ and equal bilaterally.  Neuro: Alert and oriented X 3. Moves all extremities  spontaneously.  Psych:  Responds to questions appropriately with a normal affect.  ECG: 07/21/2015: Normal sinus rhythm at 77. EKG is normal.  Assessment / Plan:   1. Sinus tachycardia - better , tolerating the Toprol XL quite well.   2. Premature ventricular contractions 3. Mitral regurgitation - mild  Follow up in 1 year with Dr. Dorene Ar here in [place] or with me in [place].    Nahser, Wonda Cheng, MD  07/21/2015 10:08 AM    [place],  [place], [place]  [address] Pager [phone] Phone: [phone]; Fax: [phone]   [facility]  [address] [place],   [address] [phone]   Fax [phone]

## 2015-07-21 NOTE — Patient Instructions (Signed)
Medication Instructions:  Your physician recommends that you continue on your current medications as directed. Please refer to the Current Medication list given to you today.   Labwork: none  Testing/Procedures: none  Follow-Up: Your physician wants you to follow-up in: one year with Dr. Nahser. You will receive a reminder letter in the mail two months in advance. If you don't receive a letter, please call our office to schedule the follow-up appointment.   Any Other Special Instructions Will Be Listed Below (If Applicable).   

## 2015-08-11 DIAGNOSIS — D485 Neoplasm of uncertain behavior of skin: Secondary | ICD-10-CM | POA: Diagnosis not present

## 2015-08-11 DIAGNOSIS — C44319 Basal cell carcinoma of skin of other parts of face: Secondary | ICD-10-CM | POA: Diagnosis not present

## 2015-08-11 DIAGNOSIS — C4491 Basal cell carcinoma of skin, unspecified: Secondary | ICD-10-CM

## 2015-08-11 DIAGNOSIS — L578 Other skin changes due to chronic exposure to nonionizing radiation: Secondary | ICD-10-CM | POA: Diagnosis not present

## 2015-08-11 HISTORY — DX: Basal cell carcinoma of skin, unspecified: C44.91

## 2015-09-02 DIAGNOSIS — C44319 Basal cell carcinoma of skin of other parts of face: Secondary | ICD-10-CM | POA: Diagnosis not present

## 2015-11-18 DIAGNOSIS — D485 Neoplasm of uncertain behavior of skin: Secondary | ICD-10-CM | POA: Diagnosis not present

## 2015-11-18 DIAGNOSIS — L570 Actinic keratosis: Secondary | ICD-10-CM | POA: Diagnosis not present

## 2015-11-18 DIAGNOSIS — Z1283 Encounter for screening for malignant neoplasm of skin: Secondary | ICD-10-CM | POA: Diagnosis not present

## 2015-11-18 DIAGNOSIS — L814 Other melanin hyperpigmentation: Secondary | ICD-10-CM | POA: Diagnosis not present

## 2015-11-18 DIAGNOSIS — L820 Inflamed seborrheic keratosis: Secondary | ICD-10-CM | POA: Diagnosis not present

## 2015-11-18 DIAGNOSIS — L821 Other seborrheic keratosis: Secondary | ICD-10-CM | POA: Diagnosis not present

## 2015-11-18 DIAGNOSIS — Z85828 Personal history of other malignant neoplasm of skin: Secondary | ICD-10-CM | POA: Diagnosis not present

## 2015-11-18 DIAGNOSIS — L83 Acanthosis nigricans: Secondary | ICD-10-CM | POA: Diagnosis not present

## 2015-11-18 DIAGNOSIS — D229 Melanocytic nevi, unspecified: Secondary | ICD-10-CM | POA: Diagnosis not present

## 2015-12-14 ENCOUNTER — Emergency Department
Admission: EM | Admit: 2015-12-14 | Discharge: 2015-12-14 | Disposition: A | Discharge status: Home / Self Care | Payer: Medicare Other | Attending: Emergency Medicine | Admitting: Emergency Medicine

## 2015-12-14 ENCOUNTER — Emergency Department: Payer: Medicare Other

## 2015-12-14 ENCOUNTER — Encounter: Payer: Self-pay | Admitting: *Deleted

## 2015-12-14 DIAGNOSIS — R05 Cough: Secondary | ICD-10-CM | POA: Diagnosis not present

## 2015-12-14 DIAGNOSIS — R509 Fever, unspecified: Secondary | ICD-10-CM | POA: Diagnosis not present

## 2015-12-14 DIAGNOSIS — J069 Acute upper respiratory infection, unspecified: Secondary | ICD-10-CM | POA: Insufficient documentation

## 2015-12-14 DIAGNOSIS — Z7982 Long term (current) use of aspirin: Secondary | ICD-10-CM | POA: Diagnosis not present

## 2015-12-14 DIAGNOSIS — Z79899 Other long term (current) drug therapy: Secondary | ICD-10-CM | POA: Diagnosis not present

## 2015-12-14 DIAGNOSIS — B349 Viral infection, unspecified: Secondary | ICD-10-CM | POA: Diagnosis not present

## 2015-12-14 DIAGNOSIS — B9789 Other viral agents as the cause of diseases classified elsewhere: Secondary | ICD-10-CM

## 2015-12-14 DIAGNOSIS — J988 Other specified respiratory disorders: Secondary | ICD-10-CM

## 2015-12-14 MED ORDER — ALBUTEROL SULFATE HFA 108 (90 BASE) MCG/ACT IN AERS: 2.0000 | INHALATION_SPRAY | Freq: Four times a day (QID) | RESPIRATORY_TRACT | Status: DC | PRN

## 2015-12-14 MED ORDER — ALBUTEROL SULFATE HFA 108 (90 BASE) MCG/ACT IN AERS
1.0000 | INHALATION_SPRAY | Freq: Four times a day (QID) | RESPIRATORY_TRACT | Status: DC
  Filled 2015-12-14: qty 6.7

## 2015-12-14 NOTE — ED Notes (Addendum)
Pt reports cough for 1 week.  Cough worse today with fever.  Nonproductive cough. Pt took 2 asa at [employer].  Pt alert.  Speech clear.

## 2015-12-14 NOTE — ED Provider Notes (Signed)
CSN: [id number]     Arrival date & time 12/14/15  2029 History   First MD Initiated Contact with Patient 12/14/15 2226     Chief Complaint  Patient presents with  . Cough     (Consider location/radiation/quality/duration/timing/severity/associated sxs/prior Treatment) HPI  70 year old male presents to [facility] for evaluation of cough. Cough isn't present for 4 days. He's had mild headache with nasal congestion. Today developed temperature of 101.3 at 7:30 PM, took aspirin, temperature down to 99.9. Patient denies any chest pain, shortness of breath, abdominal pain, nausea or vomiting. Wife was concerned that she heard crackles when he was breathing. Patient has no history of heart failure, asthma or COPD. He is tolerating fluids well.   Past Medical History  Diagnosis Date  . Chest pain     always at rest never with exertion  . Anxiety     hx  . Palpitations     hx   Past Surgical History  Procedure Laterality Date  . Vasectomy     Family History  Problem Relation Age of Onset  . Heart attack Father    Social History  Substance Use Topics  . Smoking status: Never Smoker   . Smokeless tobacco: None  . Alcohol Use: 1.1 oz/week    1 Glasses of wine, 1 Standard drinks or equivalent per week    Review of Systems  Constitutional: Negative.  Negative for fever, chills, activity change and appetite change.  HENT: Negative for congestion, ear pain, mouth sores, rhinorrhea, sinus pressure, sore throat and trouble swallowing.   Eyes: Negative for photophobia, pain and discharge.  Respiratory: Positive for cough and wheezing. Negative for chest tightness and shortness of breath.   Cardiovascular: Negative for chest pain and leg swelling.  Gastrointestinal: Negative for nausea, vomiting, abdominal pain, diarrhea and abdominal distention.  Genitourinary: Negative for dysuria and difficulty urinating.  Musculoskeletal: Negative for back pain, arthralgias and gait problem.  Skin:  Negative for color change and rash.  Neurological: Negative for dizziness and headaches.  Hematological: Negative for adenopathy.  Psychiatric/Behavioral: Negative for behavioral problems and agitation.      Allergies  Review of patient's allergies indicates no known allergies.  Home Medications   Prior to Admission medications   Medication Sig Start Date End Date Taking? Authorizing Provider  albuterol (PROVENTIL HFA;VENTOLIN HFA) 108 (90 Base) MCG/ACT inhaler Inhale 2 puffs into the lungs every 6 (six) hours as needed for wheezing or shortness of breath. 12/14/15   Duanne Guess, PA-C  aspirin EC 81 MG tablet Take 1 tablet (81 mg total) by mouth daily. 07/19/14   Thayer Headings, MD  cholecalciferol (VITAMIN D) 1000 UNITS tablet Take 1,000 Units by mouth daily.    Historical Provider, MD  magnesium oxide (MAG-OX) 400 MG tablet Take 400 mg by mouth every other day.    Historical Provider, MD  metoprolol succinate (TOPROL-XL) 25 MG 24 hr tablet TAKE 1 TABLET BY MOUTH EVERY DAY 01/01/15   Thayer Headings, MD  Multiple Vitamin (MULTI-VITAMIN DAILY) TABS Take by mouth.    Historical Provider, MD  saw palmetto 160 MG capsule Take 160 mg by mouth every other day.     Historical Provider, MD   BP 148/78 mmHg  Pulse 97  Temp(Src) 99.9 F (37.7 C) (Oral)  Resp 20  Ht 6\' 1"  (1.854 m)  Wt 75.297 kg  BMI 21.91 kg/m2  SpO2 96% Physical Exam  Constitutional: He is oriented to person, place, and time. He appears  well-developed and well-nourished.  HENT:  Head: Normocephalic and atraumatic.  Eyes: Conjunctivae and EOM are normal. Pupils are equal, round, and reactive to light.  Neck: Normal range of motion. Neck supple.  Cardiovascular: Normal rate, regular rhythm, normal heart sounds and intact distal pulses.   Pulmonary/Chest: Effort normal. No respiratory distress. He has wheezes (slight expiratory wheeze). He has no rales. He exhibits no tenderness.  Abdominal: Soft. Bowel sounds are  normal. He exhibits no distension and no mass. There is no tenderness. There is no guarding.  Musculoskeletal: Normal range of motion. He exhibits no edema or tenderness.  Lymphadenopathy:    He has cervical adenopathy (posterior cervical).  Neurological: He is alert and oriented to person, place, and time.  Skin: Skin is warm and dry. No rash noted.  Psychiatric: He has a normal mood and affect. His behavior is normal. Judgment and thought content normal.    ED Course  Procedures (including critical care time) Labs Review Labs Reviewed  CBC  COMPREHENSIVE METABOLIC PANEL    Imaging Review Dg Chest 2 View  12/14/2015  CLINICAL DATA:  Nonproductive cough and fever for 1 week. EXAM: CHEST  2 VIEW COMPARISON:  None. FINDINGS: The heart size and mediastinal contours are within normal limits. Both lungs are clear. Small hiatal hernia noted. No evidence of pneumothorax or pleural effusion. IMPRESSION: No active cardiopulmonary disease.  Small hiatal hernia. Electronically Signed   By: Earle Gell M.D.   On: 12/14/2015 21:58   I have personally reviewed and evaluated these images and lab results as part of my medical decision-making.   EKG Interpretation None      MDM   Final diagnoses:  Viral respiratory illness    70 year old male with upper respiratory infection, present for 4 days. Chest x-ray negative. Fever down to 99.9. Mild expiratory wheezing on exam, patient given a prescription for albuterol inhaler. Educated on red flags to return to the ER for. Call for any worsening symptoms urgent changes in health.  Duanne Guess, PA-C 12/14/15 [place], MD 12/14/15 2250

## 2015-12-14 NOTE — Discharge Instructions (Signed)
Viral Infections °A viral infection can be caused by different types of viruses. Most viral infections are not serious and resolve on their own. However, some infections may cause severe symptoms and may lead to further complications. °SYMPTOMS °Viruses can frequently cause: °· Minor sore throat. °· Aches and pains. °· Headaches. °· Runny nose. °· Different types of rashes. °· Watery eyes. °· Tiredness. °· Cough. °· Loss of appetite. °· Gastrointestinal infections, resulting in nausea, vomiting, and diarrhea. °These symptoms do not respond to antibiotics because the infection is not caused by bacteria. However, you might catch a bacterial infection following the viral infection. This is sometimes called a "superinfection." Symptoms of such a bacterial infection may include: °· Worsening sore throat with pus and difficulty swallowing. °· Swollen neck glands. °· Chills and a high or persistent fever. °· Severe headache. °· Tenderness over the sinuses. °· Persistent overall ill feeling (malaise), muscle aches, and tiredness (fatigue). °· Persistent cough. °· Yellow, green, or brown mucus production with coughing. °HOME CARE INSTRUCTIONS  °· Only take over-the-counter or prescription medicines for pain, discomfort, diarrhea, or fever as directed by your caregiver. °· Drink enough water and fluids to keep your urine clear or pale yellow. Sports drinks can provide valuable electrolytes, sugars, and hydration. °· Get plenty of rest and maintain proper nutrition. Soups and broths with crackers or rice are fine. °SEEK IMMEDIATE MEDICAL CARE IF:  °· You have severe headaches, shortness of breath, chest pain, neck pain, or an unusual rash. °· You have uncontrolled vomiting, diarrhea, or you are unable to keep down fluids. °· You or your child has an oral temperature above 102° F (38.9° C), not controlled by medicine. °· Your baby is older than 3 months with a rectal temperature of 102° F (38.9° C) or higher. °· Your baby is 3  months old or younger with a rectal temperature of 100.4° F (38° C) or higher. °MAKE SURE YOU:  °· Understand these instructions. °· Will watch your condition. °· Will get help right away if you are not doing well or get worse. °  °This information is not intended to replace advice given to you by your health care provider. Make sure you discuss any questions you have with your health care provider. °  °Document Released: 07/07/2005 Document Revised: 12/20/2011 Document Reviewed: 03/05/2015 °Elsevier Interactive Patient Education ©2016 [employer] Inc[employer] ° °

## 2015-12-14 NOTE — ED Notes (Signed)
NAD noted at time of D/C. Pt denies questions or concerns. Pt ambulatory to the lobby at this time.  

## 2015-12-31 ENCOUNTER — Other Ambulatory Visit: Payer: Self-pay | Admitting: Cardiovascular Disease

## 2016-04-26 ENCOUNTER — Other Ambulatory Visit: Payer: Self-pay | Admitting: *Deleted

## 2016-04-26 MED ORDER — METOPROLOL SUCCINATE ER 25 MG PO TB24: 25.0000 mg | ORAL_TABLET | Freq: Every day | ORAL | Status: DC

## 2016-04-26 NOTE — Telephone Encounter (Signed)
Requested Prescriptions   Signed Prescriptions Disp Refills  . metoprolol succinate (TOPROL-XL) 25 MG 24 hr tablet 90 tablet 3    Sig: Take 1 tablet (25 mg total) by mouth daily.    Authorizing Provider: Thayer Headings    Ordering User: Britt Bottom

## 2016-04-28 ENCOUNTER — Other Ambulatory Visit: Payer: Self-pay | Admitting: *Deleted

## 2016-04-28 MED ORDER — METOPROLOL SUCCINATE ER 25 MG PO TB24
25.0000 mg | ORAL_TABLET | Freq: Every day | ORAL | Status: DC
Start: 2016-04-28 — End: 2016-07-14

## 2016-04-28 NOTE — Telephone Encounter (Signed)
Requested Prescriptions   Signed Prescriptions Disp Refills  . metoprolol succinate (TOPROL-XL) 25 MG 24 hr tablet 90 tablet 0    Sig: Take 1 tablet (25 mg total) by mouth daily.    Authorizing Provider: Thayer Headings    Ordering User: Britt Bottom

## 2016-07-02 DIAGNOSIS — L821 Other seborrheic keratosis: Secondary | ICD-10-CM | POA: Diagnosis not present

## 2016-07-02 DIAGNOSIS — Z85828 Personal history of other malignant neoplasm of skin: Secondary | ICD-10-CM | POA: Diagnosis not present

## 2016-07-02 DIAGNOSIS — L820 Inflamed seborrheic keratosis: Secondary | ICD-10-CM | POA: Diagnosis not present

## 2016-07-06 NOTE — Progress Notes (Signed)
Cardiology Office Note   Date:  07/14/2016   ID:  Andrew [name], DOB [birth year], MRN [id number]  Referring Doctor:  Suzan Garibaldi, FNP   Cardiologist:   Wende Bushy, MD   Reason for consultation:  Chief Complaint  Patient presents with  . Follow-up    some cp here and there. no swelling or sob. no other complaints.      History of Present Illness: Andrew [name] is a 70 y.o. male who presents for Follow up.  Patient reports episodes of chest pain. He's had this for several years. The center of the chest sometimes left or right side of the chest. Doubt sensation, mild in intensity, nonradiating, mostly at rest. Nonexertional. He does not think it is related to reflux. Different than a sensation of palpitation.  Patient tries to remain physically active. He can walk 2 miles onto some biking without any chest pain or shortness of breath.  He believes that the metoprolol has up with the palpitations.  Patient denies PND, orthopnea, edema. No shortness of breath. No lightheadedness or passing out.   ROS:  Please see the history of present illness. Aside from mentioned under HPI, all other systems are reviewed and negative.     Past Medical History:  Diagnosis Date  . Anxiety    hx  . Chest pain    always at rest never with exertion  . Palpitations    hx    Past Surgical History:  Procedure Laterality Date  . VASECTOMY       reports that he has never smoked. He does not have any smokeless tobacco history on file. He reports that he drinks about 1.1 oz of alcohol per week . He reports that he does not use drugs.   family history includes Heart attack in his father.   Outpatient Medications Prior to Visit  Medication Sig Dispense Refill  . aspirin EC 81 MG tablet Take 1 tablet (81 mg total) by mouth daily. 90 tablet 3  . cholecalciferol (VITAMIN D) 1000 UNITS tablet Take 1,000 Units by mouth daily.    . metoprolol succinate (TOPROL-XL) 25 MG 24 hr tablet Take  1 tablet (25 mg total) by mouth daily. 90 tablet 0  . Multiple Vitamin (MULTI-VITAMIN DAILY) TABS Take by mouth.    . saw palmetto 160 MG capsule Take 160 mg by mouth every other day.     . albuterol (PROVENTIL HFA;VENTOLIN HFA) 108 (90 Base) MCG/ACT inhaler Inhale 2 puffs into the lungs every 6 (six) hours as needed for wheezing or shortness of breath. (Patient not taking: Reported on 07/14/2016) 1 Inhaler 2  . magnesium oxide (MAG-OX) 400 MG tablet Take 400 mg by mouth every other day.     No facility-administered medications prior to visit.      Allergies: Review of patient's allergies indicates no known allergies.    PHYSICAL EXAM: VS:  BP 112/70 (BP Location: Left Arm, Patient Position: Sitting, Cuff Size: Normal)   Pulse 69   Ht 6\' 1"  (1.854 m)   Wt 163 lb 12.8 oz (74.3 kg)   BMI 21.61 kg/m  , Body mass index is 21.61 kg/m. Wt Readings from Last 3 Encounters:  07/14/16 163 lb 12.8 oz (74.3 kg)  12/14/15 166 lb (75.3 kg)  07/21/15 171 lb 8 oz (77.8 kg)    GENERAL:  well developed, well nourished, not in acute distress HEENT: normocephalic, pink conjunctivae, anicteric sclerae, no xanthelasma, normal dentition, oropharynx clear NECK:  no  neck vein engorgement, JVP normal, no hepatojugular reflux, carotid upstroke brisk and symmetric, no bruit, no thyromegaly, no lymphadenopathy LUNGS:  good respiratory effort, clear to auscultation bilaterally CV:  PMI not displaced, no thrills, no lifts, S1 and S2 within normal limits, no palpable S3 or S4, no murmurs, no rubs, no gallops ABD:  Soft, nontender, nondistended, normoactive bowel sounds, no abdominal aortic bruit, no hepatomegaly, no splenomegaly MS: nontender back, no kyphosis, no scoliosis, no joint deformities EXT:  2+ DP/PT pulses, no edema, no varicosities, no cyanosis, no clubbing SKIN: warm, nondiaphoretic, normal turgor, no ulcers NEUROPSYCH: alert, oriented to person, place, and time, sensory/motor grossly intact, normal  mood, appropriate affect  Recent Labs: No results found for requested labs within last 8760 hours.   Lipid Panel No results found for: CHOL, TRIG, HDL, CHOLHDL, VLDL, LDLCALC, LDLDIRECT   Other studies Reviewed:  EKG:  The ekg from 07/14/2016 was personally reviewed by me and it revealed sinus rhythm, 69 BPM  Additional studies/ records that were reviewed personally reviewed by me today include:  Echo 11/30/2011: Left ventricle: The cavity size was normal. There was mild focal basal hypertrophy of the septum. Systolic function was normal. The estimated ejection fraction was in the range of 55% to 60%. Wall motion was normal; there were no regional wall motion abnormalities. Left ventricular diastolic function parameters were normal. - Mitral valve: Mild regurgitation. - Left atrium: The atrium was normal in size. - Pulmonary arteries: Systolic pressure was within the normal range. Impressions: - Murmur could be secondarty to proximal septal thickening. No [name] or significant outflow tract obstruction.  ASSESSMENT AND PLAN: Sinus tachycardia  PVCs History of sinus tachycardia. Heart rate is improved. Continue with metoprolol XL.   Mitral regurgitation - mild No appreciable murmur. Continue to monitor.  Chest pain Per history, appears atypical. However, he does not feel this is related to reflux or 2 sensation of palpitation. He does not believe it is related to position or musculoskeletal issues. There has been going on for years, patient has not had a stress this in the past. For patient reassurance, recommend treadmill test, more to rule out CAD.  Current medicines are reviewed at length with the patient today.  The patient does not have concerns regarding medicines.  Labs/ tests ordered today include: No orders of the defined types were placed in this encounter.   I had a lengthy and detailed discussion with the patient regarding diagnoses, prognosis,  diagnostic options, treatment options , and side effects of medications.   I counseled the patient on importance of lifestyle modification including heart healthy diet, regular physical activity    Disposition:   FU with undersigned after tests/in one year  I spent at least 40 minutes with the patient today and more than 50% of the time was spent counseling the patient and coordinating care.     Signed, Wende Bushy, MD  07/14/2016 10:08 AM    [place]  This note was generated in part with voice recognition software and I apologize for any typographical errors that were not detected and corrected.

## 2016-07-14 ENCOUNTER — Encounter: Payer: Self-pay | Admitting: Cardiology

## 2016-07-14 ENCOUNTER — Ambulatory Visit (INDEPENDENT_AMBULATORY_CARE_PROVIDER_SITE_OTHER): Payer: Medicare Other | Admitting: Cardiology

## 2016-07-14 VITALS — BP 112/70 | HR 69 | Ht 73.0 in | Wt 163.8 lb

## 2016-07-14 DIAGNOSIS — I493 Ventricular premature depolarization: Secondary | ICD-10-CM

## 2016-07-14 DIAGNOSIS — R0789 Other chest pain: Secondary | ICD-10-CM

## 2016-07-14 MED ORDER — METOPROLOL SUCCINATE ER 25 MG PO TB24: 25.0000 mg | ORAL_TABLET | Freq: Every day | ORAL | 3 refills | Status: DC

## 2016-07-14 NOTE — Patient Instructions (Signed)
Medication Instructions:  Refill of metoprolol sent in to [facility] mail order pharmacy.   Testing/Procedures: Your physician has requested that you have an exercise tolerance test. For further information please visit [email or web address]. Please also follow instruction sheet, as given.   Do not drink or eat foods with caffeine for 24 hours before the test.  If you use an inhaler, bring it with you to the test.  Do not smoke for 4 hours before the test.  Wear comfortable shoes and clothing.   Follow-Up: Your physician wants you to follow-up in: 1 year with Dr. Yvone Neu. You will receive a reminder letter in the mail two months in advance. If you don't receive a letter, please call our office to schedule the follow-up appointment.  It was a pleasure seeing you today here in the office. Please do not hesitate to give [place] a call back if you have any further questions. [place], BSN      Exercise Stress Electrocardiogram An exercise stress electrocardiogram is a test to check how blood flows to your heart. It is done to find areas of poor blood flow. You will need to walk on a treadmill for this test. The electrocardiogram will record your heartbeat when you are at rest and when you are exercising. BEFORE THE PROCEDURE  Do not have drinks with caffeine or foods with caffeine for 24 hours before the test, or as told by your doctor. This includes coffee, tea (even decaf tea), sodas, chocolate, and cocoa.  Follow your doctor's instructions about eating and drinking before the test.  Ask your doctor what medicines you should or should not take before the test. Take your medicines with water unless told by your doctor not to.  If you use an inhaler, bring it with you to the test.  Bring a snack to eat after the test.  Do not  smoke for 4 hours before the test.  Do not put lotions, powders, creams, or oils on your chest before the test.  Wear comfortable shoes and  clothing. PROCEDURE  You will have patches put on your chest. Small areas of your chest may need to be shaved. Wires will be connected to the patches.  Your heart rate will be watched while you are resting and while you are exercising.  You will walk on the treadmill. The treadmill will slowly get faster to raise your heart rate.  The test will take about 1-2 hours. AFTER THE PROCEDURE  Your heart rate and blood pressure will be watched after the test.  You may return to your normal diet, activities, and medicines or as told by your doctor.   This information is not intended to replace advice given to you by your health care provider. Make sure you discuss any questions you have with your health care provider.   Document Released: 03/15/2008 Document Revised: 10/18/2014 Document Reviewed: 06/04/2013 Elsevier Interactive Patient Education [employer].

## 2016-08-13 ENCOUNTER — Ambulatory Visit (INDEPENDENT_AMBULATORY_CARE_PROVIDER_SITE_OTHER): Payer: Medicare Other

## 2016-08-13 DIAGNOSIS — I493 Ventricular premature depolarization: Secondary | ICD-10-CM

## 2016-08-13 DIAGNOSIS — R0789 Other chest pain: Secondary | ICD-10-CM

## 2016-08-13 LAB — EXERCISE TOLERANCE TEST
CHL CUP MPHR: 152 {beats}/min
CHL CUP RESTING HR STRESS: 99 {beats}/min
CSEPEDS: 8 s
Estimated workload: 5.9 METS
Exercise duration (min): 4 min
Peak HR: 137 {beats}/min
Percent HR: 90 %

## 2016-11-23 DIAGNOSIS — Z1283 Encounter for screening for malignant neoplasm of skin: Secondary | ICD-10-CM | POA: Diagnosis not present

## 2016-11-23 DIAGNOSIS — D229 Melanocytic nevi, unspecified: Secondary | ICD-10-CM | POA: Diagnosis not present

## 2016-11-23 DIAGNOSIS — D1800 Hemangioma unspecified site: Secondary | ICD-10-CM | POA: Diagnosis not present

## 2016-11-23 DIAGNOSIS — L570 Actinic keratosis: Secondary | ICD-10-CM | POA: Diagnosis not present

## 2016-11-23 DIAGNOSIS — I788 Other diseases of capillaries: Secondary | ICD-10-CM | POA: Diagnosis not present

## 2016-11-23 DIAGNOSIS — L821 Other seborrheic keratosis: Secondary | ICD-10-CM | POA: Diagnosis not present

## 2016-11-23 DIAGNOSIS — Z85828 Personal history of other malignant neoplasm of skin: Secondary | ICD-10-CM | POA: Diagnosis not present

## 2016-11-23 DIAGNOSIS — L814 Other melanin hyperpigmentation: Secondary | ICD-10-CM | POA: Diagnosis not present

## 2017-02-11 ENCOUNTER — Telehealth: Payer: Self-pay | Admitting: Cardiology

## 2017-02-11 NOTE — Telephone Encounter (Signed)
Patient states that he was bending down to clean up something in his shop and he developed a "stronger pounding heart beat" and then he went to mow the yard on a embankment and then started having shortness of breath. He was concerned and wanted to review. He states that he is going on a flight to [place] and will be gone for a week. He denied any chest pain with this episode and states that he just had that pounding sensation with bending down and then the shortness of breath with mowing. Instructed him to monitor his symptoms, blood pressure readings, heart rate, and if symptoms persisted or worsened to seek care at emergency department. Set him up to come in on 02/23/17 to see Dr. Yvone Neu. He verbalized understanding of our conversation, agreement with plan, and had no further questions at this time.

## 2017-02-11 NOTE — Telephone Encounter (Signed)
Pt wife states he was mowing and came inside with "pounding heart" and elevated BP 190/bottom was "normal" HR was "high", not sure the #. States his BP now 110/60 HR 110. States during the episode he was SOB, is currently not SOB.

## 2017-02-23 ENCOUNTER — Ambulatory Visit (INDEPENDENT_AMBULATORY_CARE_PROVIDER_SITE_OTHER): Payer: Medicare Other

## 2017-02-23 ENCOUNTER — Ambulatory Visit (INDEPENDENT_AMBULATORY_CARE_PROVIDER_SITE_OTHER): Payer: Medicare Other | Admitting: Cardiology

## 2017-02-23 ENCOUNTER — Encounter: Payer: Self-pay | Admitting: Cardiology

## 2017-02-23 VITALS — BP 110/68 | HR 75 | Ht 73.0 in | Wt 165.8 lb

## 2017-02-23 DIAGNOSIS — R002 Palpitations: Secondary | ICD-10-CM | POA: Diagnosis not present

## 2017-02-23 DIAGNOSIS — R0789 Other chest pain: Secondary | ICD-10-CM | POA: Diagnosis not present

## 2017-02-23 NOTE — Patient Instructions (Signed)
Labwork: Your physician recommends that you return for lab work. Make sure not to eat or drink after midnight prior to coming in for these to be done. Go to the [facility] of [facility] and check in at the front desk with lab slips. No appointment is needed for this.    Testing/Procedures: Your physician has recommended that you wear an Zio monitor. Zio monitors are medical devices that record the heart's electrical activity. Doctors most often [place] these monitors to diagnose arrhythmias. Arrhythmias are problems with the speed or rhythm of the heartbeat. The monitor is a small, portable device. You can wear one while you do your normal daily activities. This is usually used to diagnose what is causing palpitations/syncope (passing out).    Follow-Up: Your physician wants you to follow-up in: 6 months. You will receive a reminder letter in the mail two months in advance. If you don't receive a letter, please call our office to schedule the follow-up appointment.  It was a pleasure seeing you today here in the office. Please do not hesitate to give [place] a call back if you have any further questions. [place], BSN     Cardiac Event Monitoring A cardiac event monitor is a small recording device that is used to detect abnormal heart rhythms (arrhythmias). The monitor is used to record your heart rhythm when you have symptoms, such as:  Fast heartbeats (palpitations), such as heart racing or fluttering.  Dizziness.  Fainting or light-headedness.  Unexplained weakness. Some monitors are wired to electrodes placed on your chest. Electrodes are flat, sticky disks that attach to your skin. Other monitors may be hand-held or worn on the wrist. The monitor can be worn for up to 30 days. If the monitor is attached to your chest, a technician will prepare your chest for the electrode placement and show you how to work the monitor. Take time to practice using the monitor before you  leave the office. Make sure you understand how to send the information from the monitor to your health care provider. In some cases, you may need to use a landline telephone instead of a cell phone. What are the risks? Generally, this device is safe to use, but it possible that the skin under the electrodes will become irritated. How to use your cardiac event monitor  Wear your monitor at all times, except when you are in water:  Do not let the monitor get wet.  Take the monitor off when you bathe. Do not swim or use a hot tub with it on.  Keep your skin clean. Do not put body lotion or moisturizer on your chest.  Change the electrodes as told by your health care provider or any time they stop sticking to your skin. You may need to use medical tape to keep them on.  Try to put the electrodes in slightly different places on your chest to help prevent skin irritation. They must remain in the area under your left breast and in the upper right section of your chest.  Make sure the monitor is safely clipped to your clothing or in a location close to your body that your health care provider recommends.  Press the button to record as soon as you feel heart-related symptoms, such as:  Dizziness.  Weakness.  Light-headedness.  Palpitations.  Thumping or pounding in your chest.  Shortness of breath.  Unexplained weakness.  Keep a diary of your activities, such as walking, doing chores,  and taking medicine. It is very important to note what you were doing when you pushed the button to record your symptoms. This will help your health care provider determine what might be contributing to your symptoms.  Send the recorded information as recommended by your health care provider. It may take some time for your health care provider to process the results.  Change the batteries as told by your health care provider.  Keep electronic devices away from your monitor. This  includes:  Tablets.  MP3 players.  Cell phones.  While wearing your monitor you should avoid:  Electric blankets.  Armed forces operational officer.  Electric toothbrushes.  Microwave ovens.  Magnets.  Metal detectors. Get help right away if:  You have chest pain.  You have extreme difficulty breathing or shortness of breath.  You develop a very fast heartbeat that persists.  You develop dizziness that does not go away.  You faint or constantly feel like you are about to faint. Summary  A cardiac event monitor is a small recording device that is used to help detect abnormal heart rhythms (arrhythmias).  The monitor is used to record your heart rhythm when you have heart-related symptoms.  Make sure you understand how to send the information from the monitor to your health care provider.  It is important to press the button on the monitor when you have any heart-related symptoms.  Keep a diary of your activities, such as walking, doing chores, and taking medicine. It is very important to note what you were doing when you pushed the button to record your symptoms. This will help your health care provider learn what might be causing your symptoms. This information is not intended to replace advice given to you by your health care provider. Make sure you discuss any questions you have with your health care provider. Document Released: 07/06/2008 Document Revised: 09/11/2016 Document Reviewed: 09/11/2016 Elsevier Interactive Patient Education  2017 [employer] Profile Test Why am I having this test? The lipid profile test gives results that can help predict the likelihood of developing heart disease. The test is also used to monitor treatment for high cholesterol to see if you are reaching your goals. A lipid profile measures the following:  Total cholesterol. Cholesterol is a waxy fat in your blood. If your total cholesterol is elevated, this can increase your risk of coronary  heart disease.  High-density lipoprotein (HDL). This is known as the good cholesterol. Having a high level of HDL is good. Your HDL level may be low if you smoke or do not get enough exercise.  Low-density lipoprotein (LDL). This is known as the bad cholesterol and is responsible for the formation of plaque in the arteries. Having a low level of LDL is best.  Cholesterol to HDL ratio. This is calculated by dividing the total cholesterol by the HDL cholesterol. The ratio is used by health care providers for determining your risk of heart disease. A low ratio is best.  Triglycerides. These are a type of fat in the blood responsible for providing energy to your cells. Low levels are best. What kind of sample is taken? A blood sample is required for this test. It is usually collected by inserting a needle into a vein. How do I prepare for this test? Do not eat or drink anything after midnight on the night before the test or as directed by your health care provider. What are the reference ranges? Reference ranges are considered healthy ranges established after  testing a large group of healthy people. Reference ranges may vary among different people, labs, and hospitals. It is your responsibility to obtain your test results. Ask the lab or department performing the test when and how you will get your results. Reference ranges for the lipid profile test are as follows: Total Cholesterol   Adult or elderly: less than 200 mg/dL or less than 5.20 mmol/L (SI units).  Child: 120-200 mg/dL.  Infant: 70-175 mg/dL.  Newborns: 53-135 mg/dL. HDL   Male: greater than 45 mg/dL or greater than 0.75 mmol/L (SI units).  Male: greater than 55 mg/dL or greater than 0.91 mmol/L (SI units). HDL reference values based on risk of heart disease:  For low risk of heart disease:  Male: 60 mg/dL or 1.55 mmol/L.  Male: 70 mg/dL or 1.81 mmol/L.  For moderate risk of heart disease:  Male: 45 mg/dL or 1.17  mmol/L.  Male: 55 mg/dL or 1.42 mmol/L.  For high risk of heart disease:  Male: 25 mg/dL or 0.65 mmol/L.  Male: 35 mg/dL or 0.90 mmol/L. LDL   Adult: less than 130 mg/dL.  Children: less than 110 mg/dL. Cholesterol to HDL Ratio  Reference values based on risk for coronary heart disease:  Risk that is one half average:  Male: 3.4.  Male: 3.3.  Average risk:  Male: 5.0.  Male: 4.4.  Risk that is two times average (moderate risk):  Male: 10.0.  Male: 7.0.  Risk that is three times average (high risk):    Male: 11.0. Triglycerides   Adult or elderly:  Male: 40-160 mg/dL or 0.45-1.81 mmol/L (SI units).  Male: 35-135 mg/dL or 0.40-1.52 mmol/L (SI units).  Children 53-68 years old:  Male: 30-86 mg/dL.  Male: 32-99 mg/dL.  Children 101-55 years old:  Male: 31-108 mg/dL.  Male: 35-114 mg/dL.  Children 11-53 years old:  Male: 36-138 mg/dL.  Male: 41-138 mg/dL.  Children 35-75 years old:  Male: 40-163 mg/dL.  Male: 40-128 mg/dL. Triglycerides should be less than 400 mg/dL even when you are not fasting. What do the results mean? Talk with your health care provider to discuss your results, treatment options, and if necessary, the need for more tests. Talk with your health care provider if you have any questions about your results. Talk with your health care provider to discuss your results, treatment options, and if necessary, the need for more tests. Talk with your health care provider if you have any questions about your results. This information is not intended to replace advice given to you by your health care provider. Make sure you discuss any questions you have with your health care provider. Document Released: 10/21/2004 Document Revised: 06/02/2016 Document Reviewed: 01/17/2014 Elsevier Interactive Patient Education  2017 [employer].

## 2017-02-23 NOTE — Progress Notes (Signed)
Cardiology Office Note   Date:  02/23/2017   ID:  Andrew [name], DOB November 29, 1945, MRN [id number]  Referring Doctor:  Suzan Garibaldi, FNP   Cardiologist:   Wende Bushy, MD   Reason for consultation:  Chief Complaint  Patient presents with  . other    Patient c/o SOB and heart pounding. Meds reviewed verbally with patient.       History of Present Illness: Andrew [name] is a 71 y.o. male who presents for Follow up for palpitations  Patient describes an episode roughly 2 weeks ago. He had sudden onset of pounding, fluttering in the chest. This happened when he bent forward and stood back up. This has never happened to him before,  severity was intense, symptoms lasted several minutes, symptoms in the chest. Despite having the episode of fluttering, he decided to finish mowing the lawn (at work) and was short of breath doing that. He then drove home. He checked his blood pressure, it recorded heart rates in the 140s, blood pressure was elevated. Second time that he checked, the heart rate was not so fast and blood pressure was not so high. Eventually, his symptoms subsided and spontaneously resolved. He has not had any more symptoms since then.   He continues to have occasional palpitations and mild chest pain, mostly when resting. None with exertion or physical activity. Since that episode of severe palpitations and pounding, he is gone back to mowing the lawn and doing his regular staff without any chest pain or shortness of breath.  Patient denies PND, orthopnea, edema.   ROS:  Please see the history of present illness. Aside from mentioned under HPI, all other systems are reviewed and negative.    Past Medical History:  Diagnosis Date  . Anxiety    hx  . Chest pain    always at rest never with exertion  . Palpitations    hx    Past Surgical History:  Procedure Laterality Date  . VASECTOMY       reports that he has never smoked. He has never used smokeless tobacco.  He reports that he drinks about 1.1 oz of alcohol per week . He reports that he does not use drugs.   family history includes Heart attack in his father.   Outpatient Medications Prior to Visit  Medication Sig Dispense Refill  . aspirin EC 81 MG tablet Take 1 tablet (81 mg total) by mouth daily. 90 tablet 3  . cholecalciferol (VITAMIN D) 1000 UNITS tablet Take 1,000 Units by mouth daily.    . metoprolol succinate (TOPROL-XL) 25 MG 24 hr tablet Take 1 tablet (25 mg total) by mouth daily. 90 tablet 3  . Multiple Vitamin (MULTI-VITAMIN DAILY) TABS Take by mouth.    . saw palmetto 160 MG capsule Take 160 mg by mouth every other day.     . albuterol (PROVENTIL HFA;VENTOLIN HFA) 108 (90 Base) MCG/ACT inhaler Inhale 2 puffs into the lungs every 6 (six) hours as needed for wheezing or shortness of breath. (Patient not taking: Reported on 07/14/2016) 1 Inhaler 2  . magnesium oxide (MAG-OX) 400 MG tablet Take 400 mg by mouth every other day.     No facility-administered medications prior to visit.      Allergies: Patient has no known allergies.    PHYSICAL EXAM: VS:  BP 110/68 (BP Location: Left Arm, Patient Position: Sitting, Cuff Size: Normal)   Pulse 75   Ht '6\' 1"'  (1.854 m)   Wt  165 lb 12 oz (75.2 kg)   BMI 21.87 kg/m  , Body mass index is 21.87 kg/m. Wt Readings from Last 3 Encounters:  02/23/17 165 lb 12 oz (75.2 kg)  07/14/16 163 lb 12.8 oz (74.3 kg)  12/14/15 166 lb (75.3 kg)    PHYSICAL EXAM: VS:  BP 110/68 (BP Location: Left Arm, Patient Position: Sitting, Cuff Size: Normal)   Pulse 75   Ht '6\' 1"'  (1.854 m)   Wt 165 lb 12 oz (75.2 kg)   BMI 21.87 kg/m  , BMI Body mass index is 21.87 kg/m. GENERAL:  well developed, well nourished, not in acute distress HEENT: normocephalic, pink conjunctivae, anicteric sclerae, no xanthelasma, normal dentition, oropharynx clear NECK:  no neck vein engorgement, JVP normal, no hepatojugular reflux, carotid upstroke brisk and symmetric, no bruit,  no thyromegaly, no lymphadenopathy LUNGS:  good respiratory effort, clear to auscultation bilaterally CV:  PMI not displaced, no thrills, no lifts, S1 and S2 within normal limits, no palpable S3 or S4, no murmurs, no rubs, no gallops ABD:  Soft, nontender, nondistended, normoactive bowel sounds, no abdominal aortic bruit, no hepatomegaly, no splenomegaly MS: nontender back, no kyphosis, no scoliosis, no joint deformities EXT:  2+ DP/PT pulses, no edema, no varicosities, no cyanosis, no clubbing SKIN: warm, nondiaphoretic, normal turgor, no ulcers NEUROPSYCH: alert, oriented to person, place, and time, sensory/motor grossly intact, normal mood, appropriate affect    Recent Labs: No results found for requested labs within last 8760 hours.   Lipid Panel No results found for: CHOL, TRIG, HDL, CHOLHDL, VLDL, LDLCALC, LDLDIRECT   Other studies Reviewed:  EKG:  The ekg from 07/14/2016 was personally reviewed by me and it revealed sinus rhythm, 69 BPM  Additional studies/ records that were reviewed personally reviewed by me today include:  Echo 11/30/2011: Left ventricle: The cavity size was normal. There was mild focal basal hypertrophy of the septum. Systolic function was normal. The estimated ejection fraction was in the range of 55% to 60%. Wall motion was normal; there were no regional wall motion abnormalities. Left ventricular diastolic function parameters were normal. - Mitral valve: Mild regurgitation. - Left atrium: The atrium was normal in size. - Pulmonary arteries: Systolic pressure was within the normal range. Impressions: - Murmur could be secondarty to proximal septal thickening. No [name] or significant outflow tract obstruction.  Treadmill stress test 08/13/2016: Exercise stress test Resting EKG showing normal sinus rhythm, rate 97 bpm, no significant ST or T-wave changes Resting blood pressure 139/92 Regular Bruce protocol performed Total exercise time  4 minutes, target heart rate achieved 137 bpm, 90% of maximum predicted heart rate 5.9 METS PVCs noted No significant ST or T-wave changes at peak exercise or in recovery concerning for ischemia Heart rate down to 98 bpm at 3 minutes in recovery  ASSESSMENT AND PLAN: Sinus tachycardia  PVCs History of sinus tachycardia. Resting heart rate stable on metoprolol XL Recent episode is something new, possibly SVT or atrial fibrillation. We had a lengthy discussion about importance of doing a monitor. We will order a two-week monitor for now, may need to repeat another one if we do not detect anything on this one. Patient is also advised to call 911 if he happens to have another episode similar to the one to weeks ago. Patient verbalized understanding  Mitral regurgitation - mild No appreciable murmur. Continue to monitor.  Chest pain As per his last visit, this chest pain is atypical, nonexertional. Advised to follow-up with PCP to have  this further worked up. he underwent a treadmill stress test for this particular reason the first visit. That showed no evidence of ischemia but poor functional capacity. Likely deconditioning. He continues to have similar atypical non-exertional chest pain. Again advised to follow-up with PCP.  Patient is requesting to get some blood work done since he has not been to his PCP. We'll order CBC, CMP, TSH free T4, fasting lipid panel.  Current medicines are reviewed at length with the patient today.  The patient does not have concerns regarding medicines.  Labs/ tests ordered today include:  Orders Placed This Encounter  Procedures  . CBC with Differential/Platelet  . Comp Met (CMET)  . TSH  . T4, free  . Lipid panel  . LONG TERM MONITOR (3-14 DAYS)  . EKG 12-Lead    I had a lengthy and detailed discussion with the patient regarding diagnoses, prognosis, diagnostic options.   Disposition:   FU with Cardiology in 6 months or sooner  I spent at  least 40 minutes with the patient today and more than 50% of the time was spent counseling the patient and coordinating care.      Signed, Wende Bushy, MD  02/23/2017 5:13 PM    [place]  This note was generated in part with voice recognition software and I apologize for any typographical errors that were not detected and corrected.

## 2017-02-24 ENCOUNTER — Other Ambulatory Visit
Admission: RE | Admit: 2017-02-24 | Discharge: 2017-02-24 | Disposition: A | Discharge status: Home / Self Care | Payer: Medicare Other | Source: Ambulatory Visit | Attending: Cardiology | Admitting: Cardiology

## 2017-02-24 ENCOUNTER — Telehealth: Payer: Self-pay | Admitting: *Deleted

## 2017-02-24 DIAGNOSIS — R002 Palpitations: Secondary | ICD-10-CM | POA: Insufficient documentation

## 2017-02-24 DIAGNOSIS — R0789 Other chest pain: Secondary | ICD-10-CM

## 2017-02-24 LAB — COMPREHENSIVE METABOLIC PANEL
ALBUMIN: 4.2 g/dL (ref 3.5–5.0)
ALT: 14 U/L — AB (ref 17–63)
AST: 17 U/L (ref 15–41)
Alkaline Phosphatase: 58 U/L (ref 38–126)
Anion gap: 4 — ABNORMAL LOW (ref 5–15)
BUN: 16 mg/dL (ref 6–20)
CHLORIDE: 105 mmol/L (ref 101–111)
CO2: 29 mmol/L (ref 22–32)
CREATININE: 0.92 mg/dL (ref 0.61–1.24)
Calcium: 9 mg/dL (ref 8.9–10.3)
GFR calc Af Amer: >60 mL/min (ref >60)
GFR calc non Af Amer: >60 mL/min (ref >60)
GLUCOSE: 98 mg/dL (ref 65–99)
Potassium: 4.5 mmol/L (ref 3.5–5.1)
SODIUM: 138 mmol/L (ref 135–145)
Total Bilirubin: 1 mg/dL (ref 0.3–1.2)
Total Protein: 7.2 g/dL (ref 6.5–8.1)

## 2017-02-24 LAB — CBC WITH DIFFERENTIAL/PLATELET
BASOS ABS: 0 10*3/uL (ref 0–0.1)
Basophils Relative: 1 %
EOS ABS: 0.1 10*3/uL (ref 0–0.7)
EOS PCT: 3 %
HCT: 41.3 % (ref 40.0–52.0)
HEMOGLOBIN: 14.1 g/dL (ref 13.0–18.0)
Lymphocytes Relative: 29 %
Lymphs Abs: 1.2 10*3/uL (ref 1.0–3.6)
MCH: 31.3 pg (ref 26.0–34.0)
MCHC: 34.1 g/dL (ref 32.0–36.0)
MCV: 91.8 fL (ref 80.0–100.0)
Monocytes Absolute: 0.4 10*3/uL (ref 0.2–1.0)
Monocytes Relative: 9 %
NEUTROS PCT: 58 %
Neutro Abs: 2.4 10*3/uL (ref 1.4–6.5)
PLATELETS: 121 10*3/uL — AB (ref 150–440)
RBC: 4.49 MIL/uL (ref 4.40–5.90)
RDW: 12.5 % (ref 11.5–14.5)
WBC: 4.1 10*3/uL (ref 3.8–10.6)

## 2017-02-24 LAB — T4, FREE: FREE T4: 1.06 ng/dL (ref 0.61–1.12)

## 2017-02-24 LAB — LIPID PANEL
CHOLESTEROL: 184 mg/dL (ref 0–200)
HDL: 47 mg/dL (ref >40)
LDL Cholesterol: 127 mg/dL — ABNORMAL HIGH (ref 0–99)
TRIGLYCERIDES: 48 mg/dL (ref <150)
Total CHOL/HDL Ratio: 3.9 RATIO
VLDL: 10 mg/dL (ref 0–40)

## 2017-02-24 LAB — TSH: TSH: 0.286 u[IU]/mL — ABNORMAL LOW (ref 0.350–4.500)

## 2017-02-24 NOTE — Telephone Encounter (Signed)
Spoke with patients wife per release form and reviewed all lab results and recommendations. She verbalized understanding with no further questions and let her know that if should have any questions to give me a call back. She was appreciative for the call and had no further questions at this time.

## 2017-02-24 NOTE — Telephone Encounter (Signed)
-----   Message from Wende Bushy, MD sent at 02/24/2017  1:54 PM EDT ----- Pt to get established with PCP soon as previously recommended. TSh is decreased but T4 is wnl.  plt sl decreased.  LDl sl elevated, rec dietary/lifestyle changes.

## 2017-02-25 ENCOUNTER — Telehealth: Payer: Self-pay | Admitting: Cardiology

## 2017-02-25 NOTE — Telephone Encounter (Signed)
Spoke with patients wife per release form and let her know that should be fine. Let her know to call us back if she should have any further questions. She verbalized understanding of our conversation and had no further questions at this time.

## 2017-02-25 NOTE — Telephone Encounter (Signed)
Pt wife calling stating she was told yesterday that patient needs to see a PCP She has scheduled patient for one but is it about 3 weeks out Just wants to make sure this is not too far out  She says the lab results that were given the nurse stated "he needs to see his pcp"   Please advise.

## 2017-03-16 DIAGNOSIS — R002 Palpitations: Secondary | ICD-10-CM | POA: Diagnosis not present

## 2017-03-17 ENCOUNTER — Ambulatory Visit (INDEPENDENT_AMBULATORY_CARE_PROVIDER_SITE_OTHER): Payer: Medicare Other | Admitting: Physician Assistant

## 2017-03-17 ENCOUNTER — Encounter: Payer: Self-pay | Admitting: Physician Assistant

## 2017-03-17 VITALS — BP 120/74 | HR 83 | Temp 99.0°F | Resp 16 | Ht 71.75 in | Wt 165.0 lb

## 2017-03-17 DIAGNOSIS — R7989 Other specified abnormal findings of blood chemistry: Secondary | ICD-10-CM

## 2017-03-17 DIAGNOSIS — R002 Palpitations: Secondary | ICD-10-CM

## 2017-03-17 DIAGNOSIS — Z1211 Encounter for screening for malignant neoplasm of colon: Secondary | ICD-10-CM

## 2017-03-17 DIAGNOSIS — R0789 Other chest pain: Secondary | ICD-10-CM

## 2017-03-17 DIAGNOSIS — R946 Abnormal results of thyroid function studies: Secondary | ICD-10-CM

## 2017-03-17 DIAGNOSIS — Z23 Encounter for immunization: Secondary | ICD-10-CM | POA: Diagnosis not present

## 2017-03-17 DIAGNOSIS — Z125 Encounter for screening for malignant neoplasm of prostate: Secondary | ICD-10-CM | POA: Diagnosis not present

## 2017-03-17 DIAGNOSIS — Z7689 Persons encountering health services in other specified circumstances: Secondary | ICD-10-CM

## 2017-03-17 NOTE — Patient Instructions (Signed)

## 2017-03-17 NOTE — Progress Notes (Signed)
Patient: Andrew [name] Male    DOB: Oct 15, 1945   71 y.o.   MRN: [id number] Visit Date: 03/17/2017  Today's Provider: Trinna Post, PA-C   Chief Complaint  Patient presents with  . Establish Care   Subjective:     Andrew [name] is a 71 y/o man presenting today to establish care. He reports himself in general good health. He has not seen a PCP in a while.   He has a history of atypical non exertional chest pain and palpitations which has been extensively worked up by cardiology with no apparent cardiac cause. He had some HTN and sinus tachycardia, for which he is on metoprolol with good results. He had one episode of chest discomfort and SOB before taking a flight to [place]. He still has nonexertional chest pain and palpitations. Denies anxiety. Drinks 3 mellow yellows per day. Most recently he has seen Dr. Yvone Neu and is awaiting read out from [facility] patch monitoring. Had labs drawn from cardiology with low TSH but normal Free T4. Offered to repeat these, he declines. Denies reflux, SOB.   He reports he had a negative cologuard 5 years ago. No history of colon cancer in himself or his family. Declines colonoscopy. Says he has GI system of steel and doesn't want to ruin flora with colonoscopy prep. Agrees to [employer].  No history of prostate cancer in himself or his family. Declines screening with PSA.  Declines pneumonia vaccinations today. Believes he needs to be updated on Tdap and is interested in new shingles vaccine.    Hyperlipidemia  Recent lipid tests were reviewed and are high. Associated symptoms include chest pain. Pertinent negatives include no shortness of breath (Recenlty SOB But this has improved. ). He is currently on no antihyperlipidemic treatment.  Thyroid Problem  Presents for initial visit. Symptoms include palpitations. Patient reports no anxiety, cold intolerance, heat intolerance, tremors, weight gain or weight loss. Past treatments include nothing. His past  medical history is significant for hyperlipidemia.  Chest Pain   Associated symptoms include palpitations. Pertinent negatives include no cough, dizziness, headaches, numbness, shortness of breath (Recenlty SOB But this has improved. ) or weakness.  His past medical history is significant for hyperlipidemia and thyroid problem.  Pertinent negatives for past medical history include no seizures.     No Known Allergies   Current Outpatient Prescriptions:  .  aspirin EC 81 MG tablet, Take 1 tablet (81 mg total) by mouth daily., Disp: 90 tablet, Rfl: 3 .  cholecalciferol (VITAMIN D) 1000 UNITS tablet, Take 1,000 Units by mouth daily., Disp: , Rfl:  .  metoprolol succinate (TOPROL-XL) 25 MG 24 hr tablet, Take 1 tablet (25 mg total) by mouth daily., Disp: 90 tablet, Rfl: 3 .  Multiple Vitamin (MULTI-VITAMIN DAILY) TABS, Take by mouth., Disp: , Rfl:  .  saw palmetto 160 MG capsule, Take 160 mg by mouth every other day. , Disp: , Rfl:  .  Turmeric 450 MG CAPS, Take 450 mg by mouth every other day., Disp: , Rfl:   Review of Systems  Constitutional: Negative.  Negative for weight gain and weight loss.  HENT: Negative.   Respiratory: Negative for apnea, cough, choking, chest tightness, shortness of breath (Recenlty SOB But this has improved. ), wheezing and stridor.   Cardiovascular: Positive for chest pain and palpitations. Negative for leg swelling.  Gastrointestinal: Negative.   Endocrine: Negative for cold intolerance, heat intolerance, polydipsia, polyphagia and polyuria.  Genitourinary: Negative.  Musculoskeletal: Negative.   Skin: Negative.   Allergic/Immunologic: Negative.   Neurological: Negative for dizziness, tremors, seizures, syncope, facial asymmetry, speech difficulty, weakness, light-headedness, numbness and headaches.  Hematological: Does not bruise/bleed easily.  Psychiatric/Behavioral: Negative for agitation, behavioral problems, confusion, decreased concentration, dysphoric  mood, hallucinations, self-injury, sleep disturbance and suicidal ideas. The patient is not nervous/anxious and is not hyperactive.    Family History  Problem Relation Age of Onset  . Heart attack Father    Past Medical History:  Diagnosis Date  . Anxiety    hx  . Chest pain    always at rest never with exertion  . Palpitations    hx     Social History  Substance Use Topics  . Smoking status: Never Smoker  . Smokeless tobacco: Never Used  . Alcohol use 1.1 oz/week    1 Glasses of wine, 1 Standard drinks or equivalent per week   Objective:   BP 120/74 (BP Location: Right Arm, Patient Position: Sitting, Cuff Size: Normal)   Pulse 83   Temp 99 F (37.2 C) (Oral)   Resp 16   Ht 5' 11.75" (1.822 m)   Wt 165 lb (74.8 kg)   BMI 22.53 kg/m  Vitals:   03/17/17 1423  BP: 120/74  Pulse: 83  Resp: 16  Temp: 99 F (37.2 C)  TempSrc: Oral  Weight: 165 lb (74.8 kg)  Height: 5' 11.75" (1.822 m)     Physical Exam  Constitutional: He is oriented to person, place, and time. He appears well-developed and well-nourished.  HENT:  Right Ear: Tympanic membrane and external ear normal.  Left Ear: Tympanic membrane and external ear normal.  Mouth/Throat: Oropharynx is clear and moist. No oropharyngeal exudate.  Neck: Neck supple.  Cardiovascular: Normal rate and regular rhythm.   Pulmonary/Chest: Effort normal and breath sounds normal.  Abdominal: Soft. Bowel sounds are normal.  Lymphadenopathy:    He has no cervical adenopathy.  Neurological: He is alert and oriented to person, place, and time.  Skin: Skin is warm and dry.  Psychiatric: He has a normal mood and affect. His behavior is normal.        Assessment & Plan:     1. Encounter to establish care  Should have AWV to do screenings and update vaccinations.  2. Colon cancer screening  Strong recommendation to do colonoscopy, but he declines.   - Cologuard  3. Prostate cancer screening  Declined.  4. Need for  Streptococcus pneumoniae vaccination  Declines.  5. Need for Tdap vaccination  Needed, to be updated at [facility].    6. Palpitations Uncertain etiology. Patient emphatically denies anxiety today, though prior cardiology notes mention some anxiety. Cardiac workup negative. Declines PPI trial. Will try to cut back on caffeine.    7. Chest discomfort  See above.   8. Low TSH level  Declines follow up level in one month.   Return in about 6 months (around 09/16/2017) for nurse visit, vaccines.  The entirety of the information documented in the History of Present Illness, Review of Systems and Physical Exam were personally obtained by me. Portions of this information were initially documented by Ashley Royalty, CMA and reviewed by me for thoroughness and accuracy.          Trinna Post, PA-C  [facility]

## 2017-03-18 ENCOUNTER — Telehealth: Payer: Self-pay | Admitting: Physician Assistant

## 2017-03-18 NOTE — Telephone Encounter (Signed)
Didn't discuss this yesterday, but if possible, his vaccine visit should be changed to AWV, which would include all preventive services, if patient is agreeable. Thanks.

## 2017-03-24 ENCOUNTER — Telehealth: Payer: Self-pay | Admitting: Cardiology

## 2017-03-24 ENCOUNTER — Telehealth: Payer: Self-pay | Admitting: Internal Medicine

## 2017-03-24 NOTE — Telephone Encounter (Signed)
Patient wife returning call from pam for holter results .

## 2017-03-24 NOTE — Telephone Encounter (Signed)
Reviewed results w/ pt's wife.  Advised of Dr. Darnelle Bos recommendation. Pt verbalizes understanding and will call back w/ any questions or concerns.

## 2017-03-24 NOTE — Telephone Encounter (Signed)
error 

## 2017-04-18 ENCOUNTER — Telehealth: Payer: Self-pay | Admitting: Physician Assistant

## 2017-04-18 DIAGNOSIS — Z1211 Encounter for screening for malignant neoplasm of colon: Secondary | ICD-10-CM

## 2017-04-18 NOTE — Telephone Encounter (Signed)
Pt wife [name] states pt was suppose to receive a colorguard screening through the the mail and pt has not received this.  It is ok to leave a message.  [phone]/[phone]

## 2017-04-18 NOTE — Telephone Encounter (Signed)
LMTCB  Thanks.  -Joseline 

## 2017-04-18 NOTE — Telephone Encounter (Signed)
Yes, he should have gotten it by now. Can he please confirm his address? I will order it again.

## 2017-04-19 NOTE — Telephone Encounter (Signed)
Pt wife called a verified pt address is correct/MW

## 2017-04-19 NOTE — Telephone Encounter (Signed)
Please review. Thanks!  

## 2017-04-19 NOTE — Telephone Encounter (Signed)
Reordered cologuard

## 2017-04-20 NOTE — Telephone Encounter (Signed)
Left message on home voicemail advising patient Cologuard has been reordered.

## 2017-05-03 ENCOUNTER — Telehealth: Payer: Self-pay | Admitting: Physician Assistant

## 2017-05-03 NOTE — Telephone Encounter (Signed)
Pt wife called to advise pt still has not rec'd the Cologuard Screening packet. [phone]

## 2017-05-03 NOTE — Telephone Encounter (Signed)
[place], there seems to be disconnect between our EMR and the lab. My order was not in their system. Working on accessing portal, will call with updates.

## 2017-05-04 ENCOUNTER — Telehealth: Payer: Self-pay | Admitting: Physician Assistant

## 2017-05-04 ENCOUNTER — Other Ambulatory Visit: Payer: Self-pay | Admitting: Physician Assistant

## 2017-05-04 DIAGNOSIS — Z1211 Encounter for screening for malignant neoplasm of colon: Secondary | ICD-10-CM

## 2017-05-04 NOTE — Telephone Encounter (Signed)
Can we please call pt and advise forms have been printed and faxed. Should be receiving kit and call from company.

## 2017-05-04 NOTE — Progress Notes (Signed)
Forms printed and faxed. Can we please call and advise that patient should be getting kit and call from company. Do apologize for delay.

## 2017-05-10 NOTE — Telephone Encounter (Signed)
Pt wife states pt still has not rec'd his cologuard screening kit.  OE#[phone]

## 2017-05-11 NOTE — Telephone Encounter (Signed)
Requisition form re faxed to Cologuard.

## 2017-05-19 DIAGNOSIS — Z1211 Encounter for screening for malignant neoplasm of colon: Secondary | ICD-10-CM | POA: Diagnosis not present

## 2017-05-19 DIAGNOSIS — Z1212 Encounter for screening for malignant neoplasm of rectum: Secondary | ICD-10-CM | POA: Diagnosis not present

## 2017-05-19 LAB — COLOGUARD

## 2017-05-25 DIAGNOSIS — H2513 Age-related nuclear cataract, bilateral: Secondary | ICD-10-CM | POA: Diagnosis not present

## 2017-05-25 DIAGNOSIS — H43813 Vitreous degeneration, bilateral: Secondary | ICD-10-CM | POA: Diagnosis not present

## 2017-05-28 LAB — COLOGUARD: Cologuard: NEGATIVE

## 2017-05-30 ENCOUNTER — Telehealth: Payer: Self-pay

## 2017-05-30 NOTE — Telephone Encounter (Signed)
LMTCB- Cologuard results are negative.

## 2017-05-31 ENCOUNTER — Telehealth: Payer: Self-pay | Admitting: Physician Assistant

## 2017-05-31 ENCOUNTER — Encounter: Payer: Self-pay | Admitting: Physician Assistant

## 2017-05-31 NOTE — Telephone Encounter (Signed)
Order for cologuard faxed to [facility] °

## 2017-06-07 ENCOUNTER — Encounter: Payer: Self-pay | Admitting: Internal Medicine

## 2017-06-07 ENCOUNTER — Ambulatory Visit (INDEPENDENT_AMBULATORY_CARE_PROVIDER_SITE_OTHER): Payer: Medicare Other | Admitting: Internal Medicine

## 2017-06-07 VITALS — BP 120/70 | HR 76 | Ht 72.0 in | Wt 160.5 lb

## 2017-06-07 DIAGNOSIS — R079 Chest pain, unspecified: Secondary | ICD-10-CM | POA: Insufficient documentation

## 2017-06-07 DIAGNOSIS — R0789 Other chest pain: Secondary | ICD-10-CM

## 2017-06-07 DIAGNOSIS — R002 Palpitations: Secondary | ICD-10-CM

## 2017-06-07 DIAGNOSIS — I491 Atrial premature depolarization: Secondary | ICD-10-CM

## 2017-06-07 DIAGNOSIS — I493 Ventricular premature depolarization: Secondary | ICD-10-CM | POA: Diagnosis not present

## 2017-06-07 DIAGNOSIS — I4729 Other ventricular tachycardia: Secondary | ICD-10-CM | POA: Insufficient documentation

## 2017-06-07 DIAGNOSIS — I472 Ventricular tachycardia: Secondary | ICD-10-CM

## 2017-06-07 NOTE — Patient Instructions (Addendum)
Medication Instructions:  Your physician recommends that you continue on your current medications as directed. Please refer to the Current Medication list given to you today.   Labwork: none  Testing/Procedures: Your physician has requested that you have an echocardiogram. Echocardiography is a painless test that uses sound waves to create images of your heart. It provides your doctor with information about the size and shape of your heart and how well your heart's chambers and valves are working. This procedure takes approximately one hour. There are no restrictions for this procedure.    Follow-Up: Your physician wants you to follow-up in: 6 months with Dr. Saunders Revel.  You will receive a reminder letter in the mail two months in advance. If you don't receive a letter, please call our office to schedule the follow-up appointment.   Any Other Special Instructions Will Be Listed Below (If Applicable).     If you need a refill on your cardiac medications before your next appointment, please call your pharmacy.  Echocardiogram An echocardiogram, or echocardiography, uses sound waves (ultrasound) to produce an image of your heart. The echocardiogram is simple, painless, obtained within a short period of time, and offers valuable information to your health care provider. The images from an echocardiogram can provide information such as:  Evidence of coronary artery disease (CAD).  Heart size.  Heart muscle function.  Heart valve function.  Aneurysm detection.  Evidence of a past heart attack.  Fluid buildup around the heart.  Heart muscle thickening.  Assess heart valve function.  Tell a health care provider about:  Any allergies you have.  All medicines you are taking, including vitamins, herbs, eye drops, creams, and over-the-counter medicines.  Any problems you or family members have had with anesthetic medicines.  Any blood disorders you have.  Any surgeries you have  had.  Any medical conditions you have.  Whether you are pregnant or may be pregnant. What happens before the procedure? No special preparation is needed. Eat and drink normally. What happens during the procedure?  In order to produce an image of your heart, gel will be applied to your chest and a wand-like tool (transducer) will be moved over your chest. The gel will help transmit the sound waves from the transducer. The sound waves will harmlessly bounce off your heart to allow the heart images to be captured in real-time motion. These images will then be recorded.  You may need an IV to receive a medicine that improves the quality of the pictures. What happens after the procedure? You may return to your normal schedule including diet, activities, and medicines, unless your health care provider tells you otherwise. This information is not intended to replace advice given to you by your health care provider. Make sure you discuss any questions you have with your health care provider. Document Released: 09/24/2000 Document Revised: 05/15/2016 Document Reviewed: 06/04/2013 Elsevier Interactive Patient Education  2017 [employer].

## 2017-06-07 NOTE — Progress Notes (Signed)
Follow-up Outpatient Visit Date: 06/07/2017  Primary Care Provider: Paulene Floor [place] [id number]  Chief Complaint: Follow-up palpitations and chest pain  HPI:  Mr. Andrew [name] is a 71 y.o. year-old male with history of atypical chest pain, palpitations, and anxiety, who presents for follow-up. He was previously followed in our office by Dr. Yvone [name] , having last been seen in March. Since that time, Mr. Andrew [name] has felt about the same. He continues to have random chest pains at rest, which he describes as a dull, left-sided discomfort that lasts 1-10 minutes with a maximal intensity of 3/10. It does not coincide with his palpitations. He does not have any other associated symptoms. The pain never occurs with exertion; in fact he feels best when he is doing activities. He continues to walk and kayak on a regular basis.  Mr. Andrew [name] also has episodic palpitations, which he describes as a brief fluttering in the chest. He had one sustained episode prior to his last visit with Dr. Yvone [name], which persisted for several minutes and was accompanied by shortness of breath. This has not recurred. He denies lightheadedness, orthopnea, PND, edema, and claudication. He has been compliant with his medications. He remains on metoprolol succinate 25 mg daily without any adverse effects, though he is unsure that it is helping him much.  Mr. Andrew [name] does not have any daytime fatigue, though his wife notes that he snores frequently and also has apneic episodes. Mr. Andrew [name] suspects that he has sleep apnea but has never been tested. He does not believe that he would want to wear CPAP.  -------------------------------------------------------------------------------------------------- Past Medical History:  Diagnosis Date  . Anxiety    hx  . Chest pain    always at rest never with exertion  . Palpitations    hx   Past Surgical History:  Procedure Laterality Date  . EYE  SURGERY Right    Done as a child  . VASECTOMY     Allergies: Patient has no known allergies.  Social History   Social History  . Marital status: Married    Spouse name: N/A  . Number of children: N/A  . Years of education: N/A   Occupational History  . Retired    Social History Main Topics  . Smoking status: Never Smoker  . Smokeless tobacco: Never Used  . Alcohol use 1.1 oz/week    1 Glasses of wine, 1 Standard drinks or equivalent per week  . Drug use: No  . Sexual activity: Not on file   Other Topics Concern  . Not on file   Social History Narrative  . No narrative on file    Family History  Problem Relation Age of Onset  . Heart attack Father     Review of Systems: A 12-system review of systems was performed and was negative except as noted in the HPI.  --------------------------------------------------------------------------------------------------  Physical Exam: BP 120/70 (BP Location: Left Arm, Patient Position: Sitting, Cuff Size: Normal)   Pulse 76   Ht 6' (1.829 m)   Wt 160 lb 8 oz (72.8 kg)   BMI 21.77 kg/m   General:  Slender man, seated comfortably in the exam room. He is accompanied by his wife. HEENT: No conjunctival pallor or scleral icterus. Moist mucous membranes.  OP clear. Neck: Supple without lymphadenopathy, thyromegaly, JVD, or HJR. Lungs: Normal work of breathing. Clear to auscultation bilaterally without wheezes or crackles. Heart: Regular rate and rhythm without murmurs, rubs, or gallops. Non-displaced  PMI. Abd: Bowel sounds present. Soft, NT/ND without hepatosplenomegaly Ext: No lower extremity edema. Radial, PT, and DP pulses are 2+ bilaterally. Skin: Warm and dry without rash.  EKG:  Normal sinus rhythm with isolated PVC, incomplete right bundle branch block, and early repolarization in lead V2.  Lab Results  Component Value Date   WBC 4.1 02/24/2017   HGB 14.1 02/24/2017   HCT 41.3 02/24/2017   MCV 91.8 02/24/2017    PLT 121 (L) 02/24/2017    Lab Results  Component Value Date   NA 138 02/24/2017   K 4.5 02/24/2017   CL 105 02/24/2017   CO2 29 02/24/2017   BUN 16 02/24/2017   CREATININE 0.92 02/24/2017   GLUCOSE 98 02/24/2017   ALT 14 (L) 02/24/2017    Lab Results  Component Value Date   CHOL 184 02/24/2017   HDL 47 02/24/2017   LDLCALC 127 (H) 02/24/2017   TRIG 48 02/24/2017   CHOLHDL 3.9 02/24/2017    --------------------------------------------------------------------------------------------------  ASSESSMENT AND PLAN: Palpitations with PACs, PVCs, and NSVT Symptoms are stable. Event monitor after last visit with Dr. Yvone [name] showed predominantly sinus rhythm with PACs, PVCs and brief NSVT. We will continue with metoprolol succinate 25 mg daily and also obtain a transthoracic echocardiogram to evaluate for new structural abnormalities. We discussed the potential for sleep apnea leading to arrhythmias. Mr. Andrew [name] would like to defer sleep study pending results of the echocardiogram. If there is evidence of structural heart disease, particularly pulmonary hypertension, we will readdress polysomnography.  Atypical chest pain This has been long-standing and is stable. Symptoms are never exertional. Exercise tolerance test last November did not show any ischemic changes, though duration of exercise quite limited. Mr. Andrew [name] reports that he could have gone further but was told to stop as his target heart rate was achieved. We have agreed to defer additional ischemia evaluation at this time. Continue current dose of metoprolol.  Follow-up: Return to clinic in 6 months.  [name], MD 06/07/2017 3:07 PM

## 2017-06-17 ENCOUNTER — Other Ambulatory Visit: Payer: Self-pay

## 2017-06-17 ENCOUNTER — Ambulatory Visit (INDEPENDENT_AMBULATORY_CARE_PROVIDER_SITE_OTHER): Payer: Medicare Other

## 2017-06-17 DIAGNOSIS — I472 Ventricular tachycardia: Secondary | ICD-10-CM | POA: Diagnosis not present

## 2017-06-17 DIAGNOSIS — I4729 Other ventricular tachycardia: Secondary | ICD-10-CM

## 2017-06-17 DIAGNOSIS — R0789 Other chest pain: Secondary | ICD-10-CM | POA: Diagnosis not present

## 2017-08-03 ENCOUNTER — Telehealth: Payer: Self-pay | Admitting: Physician Assistant

## 2017-08-03 NOTE — Telephone Encounter (Signed)
He would need to contact a travel clinic, as we don't routinely do some of the vaccines recommended. He declined pneumonia vaccine at last visit. He is also in need of updating Tetanus. He was supposed to have scheduled an Annual Wellness Visit with Andrew [name] to get routine immunizations updated. Recommend doing that.

## 2017-08-03 NOTE — Telephone Encounter (Signed)
Mrs. Berns advised.  She is going to have Mr. Gillaspie call and schedule a wellness visit.   Thanks,   -Mickel Baas

## 2017-08-03 NOTE — Telephone Encounter (Signed)
Pt wife states they are going out of the country to [place] and is asking if pt will need any immunizations.  She is also asking about having pneumonia shot and shingles shot.    FO#[phone]/[id number]

## 2017-08-08 ENCOUNTER — Telehealth: Payer: Self-pay | Admitting: Physician Assistant

## 2017-08-08 NOTE — Telephone Encounter (Signed)
LMTCB 08/08/2017  Thanks,   -Mickel Baas

## 2017-08-08 NOTE — Telephone Encounter (Signed)
Pt wife called and states pt is requesting a Hep A, shingles, tetanus and flu shot.  Please advise.  KU#[phone]

## 2017-08-08 NOTE — Telephone Encounter (Signed)
Needs appt. Also, we are not administering shingles vaccine, we are on national backorder. Would recommend contacting local pharmacy.

## 2017-08-09 DIAGNOSIS — Z23 Encounter for immunization: Secondary | ICD-10-CM | POA: Diagnosis not present

## 2017-08-10 ENCOUNTER — Telehealth: Payer: Self-pay | Admitting: Physician Assistant

## 2017-08-10 NOTE — Telephone Encounter (Signed)
Pt's wife [name] wanted to let Mickel Baas know that pt is going to go to pharmacy to get the injections they had discussed. Thanks TNP

## 2017-08-10 NOTE — Telephone Encounter (Signed)
FYI

## 2017-08-10 NOTE — Telephone Encounter (Signed)
Noted, thank you

## 2017-08-30 DIAGNOSIS — Z23 Encounter for immunization: Secondary | ICD-10-CM | POA: Diagnosis not present

## 2017-08-30 DIAGNOSIS — Z7189 Other specified counseling: Secondary | ICD-10-CM | POA: Diagnosis not present

## 2017-10-03 ENCOUNTER — Other Ambulatory Visit: Payer: Self-pay

## 2017-10-03 DIAGNOSIS — I493 Ventricular premature depolarization: Secondary | ICD-10-CM

## 2017-10-03 DIAGNOSIS — R0789 Other chest pain: Secondary | ICD-10-CM

## 2017-10-03 MED ORDER — METOPROLOL SUCCINATE ER 25 MG PO TB24: 25.0000 mg | ORAL_TABLET | Freq: Every day | ORAL | 3 refills | Status: DC

## 2017-10-03 NOTE — Telephone Encounter (Signed)
Refill sent to [facility] for 90 day supply for Metoprolol ER Succ 25 mg.

## 2017-10-05 ENCOUNTER — Other Ambulatory Visit: Payer: Self-pay | Admitting: *Deleted

## 2017-10-05 DIAGNOSIS — I493 Ventricular premature depolarization: Secondary | ICD-10-CM

## 2017-10-05 DIAGNOSIS — R0789 Other chest pain: Secondary | ICD-10-CM

## 2017-10-05 MED ORDER — METOPROLOL SUCCINATE ER 25 MG PO TB24: 25.0000 mg | ORAL_TABLET | Freq: Every day | ORAL | 3 refills | Status: DC

## 2017-11-29 ENCOUNTER — Ambulatory Visit (INDEPENDENT_AMBULATORY_CARE_PROVIDER_SITE_OTHER): Payer: Medicare Other | Admitting: Nurse Practitioner

## 2017-11-29 ENCOUNTER — Encounter: Payer: Self-pay | Admitting: Nurse Practitioner

## 2017-11-29 VITALS — BP 122/80 | HR 73 | Ht 72.0 in | Wt 163.0 lb

## 2017-11-29 DIAGNOSIS — Z1283 Encounter for screening for malignant neoplasm of skin: Secondary | ICD-10-CM | POA: Diagnosis not present

## 2017-11-29 DIAGNOSIS — L821 Other seborrheic keratosis: Secondary | ICD-10-CM | POA: Diagnosis not present

## 2017-11-29 DIAGNOSIS — I472 Ventricular tachycardia: Secondary | ICD-10-CM

## 2017-11-29 DIAGNOSIS — R0789 Other chest pain: Secondary | ICD-10-CM | POA: Diagnosis not present

## 2017-11-29 DIAGNOSIS — R002 Palpitations: Secondary | ICD-10-CM | POA: Diagnosis not present

## 2017-11-29 DIAGNOSIS — L570 Actinic keratosis: Secondary | ICD-10-CM | POA: Diagnosis not present

## 2017-11-29 DIAGNOSIS — I788 Other diseases of capillaries: Secondary | ICD-10-CM | POA: Diagnosis not present

## 2017-11-29 DIAGNOSIS — L853 Xerosis cutis: Secondary | ICD-10-CM | POA: Diagnosis not present

## 2017-11-29 DIAGNOSIS — I38 Endocarditis, valve unspecified: Secondary | ICD-10-CM | POA: Diagnosis not present

## 2017-11-29 DIAGNOSIS — L219 Seborrheic dermatitis, unspecified: Secondary | ICD-10-CM | POA: Diagnosis not present

## 2017-11-29 DIAGNOSIS — Z85828 Personal history of other malignant neoplasm of skin: Secondary | ICD-10-CM | POA: Diagnosis not present

## 2017-11-29 DIAGNOSIS — D229 Melanocytic nevi, unspecified: Secondary | ICD-10-CM | POA: Diagnosis not present

## 2017-11-29 DIAGNOSIS — I471 Supraventricular tachycardia: Secondary | ICD-10-CM

## 2017-11-29 DIAGNOSIS — L820 Inflamed seborrheic keratosis: Secondary | ICD-10-CM | POA: Diagnosis not present

## 2017-11-29 DIAGNOSIS — I4729 Other ventricular tachycardia: Secondary | ICD-10-CM

## 2017-11-29 MED ORDER — METOPROLOL SUCCINATE ER 25 MG PO TB24: 25.0000 mg | ORAL_TABLET | Freq: Every day | ORAL | 3 refills | Status: DC

## 2017-11-29 NOTE — Patient Instructions (Signed)
Medication Instructions:  Your physician recommends that you continue on your current medications as directed. Please refer to the Current Medication list given to you today.   Labwork: NONE  Testing/Procedures: NONE  Follow-Up: Your physician wants you to follow-up in: 12 MONTHS WITH DR END. You will receive a reminder letter in the mail two months in advance. If you don't receive a letter, please call our office to schedule the follow-up appointment.   If you need a refill on your cardiac medications before your next appointment, please call your pharmacy.

## 2017-11-29 NOTE — Progress Notes (Signed)
Office Visit    Patient Name: Andrew [name] Date of Encounter: 11/29/2017  Primary Care Provider:  Trinna Post, PA-C Primary Cardiologist:  [name], MD  Chief Complaint    72 y/o ? with a history of atypical chest pain, palpitations (PSVT and NSVT) and valvular heart disease, who presents for follow-up.  Past Medical History    Past Medical History:  Diagnosis Date  . Atypical chest pain    a. Rest pain only; b. 08/2016 ETT: Ex time 50mins, no ST/T changes ->nl study.  . History of anxiety   . NSVT (nonsustained ventricular tachycardia) ([place])    a. 03/2017 Zio Monitor: 2 episodes of NSVT - up to 7 beats, max HR 207.  Marland Kitchen Palpitations    a. 03/2017 14 day Zio Monitor: PACs, PVCs, brief PSVT and NSVT-->managed w/ beta blocker.  Marland Kitchen PSVT (paroxysmal supraventricular tachycardia) ([place])    a. 03/2017  Zio monitor: 7 episodes of PSVT - up to 6 beats, max rate 193.  . Valvular heart disease    a. 06/2017 Echo: EF 60-65%. No rwma. Mild MR. Mild to mod TR. PASP 67mmHg.   Past Surgical History:  Procedure Laterality Date  . EYE SURGERY Right    Done as a child  . VASECTOMY      Allergies  No Known Allergies  History of Present Illness    72 year old male with a history of atypical chest pain, palpitations, and anxiety.  He previously underwent evaluation for atypical chest pain in November 2017 with exercise treadmill testing.  He exercised for 4 minutes and did not have any symptoms or ST/T changes.  Test was stopped because he had target.  He says now, that he felt like he could exercise much longer.  More recently, he was evaluated for palpitations with a 14-day monitor in June 2018.  This revealed PACs, PVCs, and a few brief runs of PSVT and 2 brief runs of nonsustained VT.  He did not have any reported symptoms associated with these arrhythmias.  He was maintained on beta-blocker therapy and followed up with Dr. Saunders Revel in August.  He was doing relatively well at that  time.  An echocardiogram was performed in September 2018 [place], showing normal LV function with mild mitral regurgitation and mild to moderate tricuspid regurgitation.  Pulmonary artery systolic pressure was mildly elevated at 33 mmHg.  As he had a history of snoring and there was some concern for sleep apnea at his prior visit, rec mentation was made for sleep study however patient deferred.  Since his last visit in September, he has done reasonably well.  He remains active but is not exercising necessarily.  He occasionally has a brief or fleeting episode of chest discomfort that only occurs at rest, lasts a few minutes, and resolve spontaneously.  He has never had exertional symptoms.  He and his wife walk fairly regularly without any significant limitations.  He feels as though his palpitations have been well controlled and he tolerates metoprolol well.  He denies PND, orthopnea, dizziness, syncope, edema, or early satiety.  Home Medications    Prior to Admission medications   Medication Sig Start Date End Date Taking? Authorizing Provider  aspirin EC 81 MG tablet Take 1 tablet (81 mg total) by mouth daily. 07/19/14  Yes Nahser, Wonda Cheng, MD  cholecalciferol (VITAMIN D) 1000 UNITS tablet Take 1,000 Units by mouth daily.   Yes [provider]  metoprolol succinate (TOPROL-XL) 25 MG 24 hr  tablet Take 1 tablet (25 mg total) by mouth daily. 11/29/17  Yes Theora Gianotti, NP  Multiple Vitamin (MULTI-VITAMIN DAILY) TABS Take by mouth.   Yes [provider]  saw palmetto 160 MG capsule Take 160 mg by mouth every other day.    Yes [provider]  Turmeric 450 MG CAPS Take 450 mg by mouth every other day.   Yes [provider]    Review of Systems    Overall doing well.  Occasional, brief episodes of chest discomfort.  He denies dyspnea, PND, orthopnea, dizziness, syncope, edema, or early satiety.  All other systems reviewed and are otherwise negative except  as noted above.  Physical Exam    VS:  BP 122/80 (BP Location: Left Arm, Patient Position: Sitting, Cuff Size: Normal)   Pulse 73   Ht 6' (1.829 m)   Wt 163 lb (73.9 kg)   BMI 22.11 kg/m  , BMI Body mass index is 22.11 kg/m. GEN: Well nourished, well developed, in no acute distress.  HEENT: normal.  Neck: Supple, no JVD, carotid bruits, or masses. Cardiac: RRR, no murmurs, rubs, or gallops. No clubbing, cyanosis, edema.  Radials/DP/PT 2+ and equal bilaterally.  Respiratory:  Respirations regular and unlabored, clear to auscultation bilaterally. GI: Soft, nontender, nondistended, BS + x 4. MS: no deformity or atrophy. Skin: warm and dry, no rash. Neuro:  Strength and sensation are intact. Psych: Normal affect.  Accessory Clinical Findings    ECG -regular sinus rhythm, 73, no acute ST or T changes.  Assessment & Plan    1.  Atypical chest pain: This is somewhat long-standing and occurs intermittently at rest once a month or less.  He has never had exertional symptoms.  He had previously normal exercise treadmill test in late 2017.  Echocardiogram in September 2018 showed normal LV function.  Overall, he feels that symptoms are stable.  No further evaluation at this time.  2.  Palpitations/PSVT/nonsustained VT: Patient wore an event monitor in June 2018 revealing PACs, PVCs, PSVT, and nonsustained VT.  He has not been having significant palpitations and is tolerating beta-blocker well.  Echo showed normal LV function.  Continue beta blocker therapy.  3.  Valvular heart disease: Mild MR with mild to moderate TR on echo.  We will plan to follow-up in about a year.  4.  Snoring/mild pulmonary hypertension: Patient is not currently interested in sleep study as he does not think he will be able to tolerate CPAP.   5.  Disposition: Patient has been doing well and wishes to follow-up in 1 year.   Murray Hodgkins, NP 11/29/2017, 4:32 PM

## 2017-12-15 ENCOUNTER — Telehealth: Payer: Self-pay

## 2017-12-15 NOTE — Telephone Encounter (Signed)
LMTCB and schedule AWV.  -MM 

## 2018-01-03 ENCOUNTER — Ambulatory Visit (INDEPENDENT_AMBULATORY_CARE_PROVIDER_SITE_OTHER): Payer: Medicare Other

## 2018-01-03 ENCOUNTER — Ambulatory Visit: Payer: Medicare Other

## 2018-01-03 VITALS — BP 136/64 | HR 84 | Temp 98.5°F | Ht 72.0 in | Wt 162.4 lb

## 2018-01-03 DIAGNOSIS — Z0000 Encounter for general adult medical examination without abnormal findings: Secondary | ICD-10-CM | POA: Diagnosis not present

## 2018-01-03 NOTE — Patient Instructions (Addendum)
Andrew [name] , Thank you for taking time to come for your Medicare Wellness Visit. I appreciate your ongoing commitment to your health goals. Please review the following plan we discussed and let me know if I can assist you in the future.   Screening recommendations/referrals: Colonoscopy: Up to date Recommended yearly ophthalmology/optometry visit for glaucoma screening and checkup Recommended yearly dental visit for hygiene and checkup  Vaccinations: Influenza vaccine: Up to date Pneumococcal vaccine: Pt declines today.  Tdap vaccine: Up to date Shingles vaccine: Pt declines today.     Advanced directives: Healthcare POA copy brought to office today. Requested copy of living will.   Conditions/risks identified: Recommend increasing exercise to 3 days a week for at least 30 minutes.   Next appointment: 01/10/18 @ 10:00 AM  Preventive Care 70 Years and Older, Male Preventive care refers to lifestyle choices and visits with your health care provider that can promote health and wellness. What does preventive care include?  A yearly physical exam. This is also called an annual well check.  Dental exams once or twice a year.  Routine eye exams. Ask your health care provider how often you should have your eyes checked.  Personal lifestyle choices, including:  Daily care of your teeth and gums.  Regular physical activity.  Eating a healthy diet.  Avoiding tobacco and drug use.  Limiting alcohol use.  Practicing safe sex.  Taking low doses of aspirin every day.  Taking vitamin and mineral supplements as recommended by your health care provider. What happens during an annual well check? The services and screenings done by your health care provider during your annual well check will depend on your age, overall health, lifestyle risk factors, and family history of disease. Counseling  Your health care provider may ask you questions about your:  Alcohol use.  Tobacco  use.  Drug use.  Emotional well-being.  Home and relationship well-being.  Sexual activity.  Eating habits.  History of falls.  Memory and ability to understand (cognition).  Work and work Statistician. Screening  You may have the following tests or measurements:  Height, weight, and BMI.  Blood pressure.  Lipid and cholesterol levels. These may be checked every 5 years, or more frequently if you are over 70 years old.  Skin check.  Lung cancer screening. You may have this screening every year starting at age 70 if you have a 30-pack-year history of smoking and currently smoke or have quit within the past 15 years.  Fecal occult blood test (FOBT) of the stool. You may have this test every year starting at age 70.  Flexible sigmoidoscopy or colonoscopy. You may have a sigmoidoscopy every 5 years or a colonoscopy every 10 years starting at age 70.  Prostate cancer screening. Recommendations will vary depending on your family history and other risks.  Hepatitis C blood test.  Hepatitis B blood test.  Sexually transmitted disease (STD) testing.  Diabetes screening. This is done by checking your blood sugar (glucose) after you have not eaten for a while (fasting). You may have this done every 1-3 years.  Abdominal aortic aneurysm (AAA) screening. You may need this if you are a current or former smoker.  Osteoporosis. You may be screened starting at age 70 if you are at high risk. Talk with your health care provider about your test results, treatment options, and if necessary, the need for more tests. Vaccines  Your health care provider may recommend certain vaccines, such as:  Influenza vaccine. This  is recommended every year.  Tetanus, diphtheria, and acellular pertussis (Tdap, Td) vaccine. You may need a Td booster every 10 years.  Zoster vaccine. You may need this after age 70.  Pneumococcal 13-valent conjugate (PCV13) vaccine. One dose is recommended after age  70.  Pneumococcal polysaccharide (PPSV23) vaccine. One dose is recommended after age 70. Talk to your health care provider about which screenings and vaccines you need and how often you need them. This information is not intended to replace advice given to you by your health care provider. Make sure you discuss any questions you have with your health care provider. Document Released: 10/24/2015 Document Revised: 06/16/2016 Document Reviewed: 07/29/2015 Elsevier Interactive Patient Education  2017 [place] Prevention in the Home Falls can cause injuries. They can happen to people of all ages. There are many things you can do to make your home safe and to help prevent falls. What can I do on the outside of my home?  Regularly fix the edges of walkways and driveways and fix any cracks.  Remove anything that might make you trip as you walk through a door, such as a raised step or threshold.  Trim any bushes or trees on the path to your home.  Use bright outdoor lighting.  Clear any walking paths of anything that might make someone trip, such as rocks or tools.  Regularly check to see if handrails are loose or broken. Make sure that both sides of any steps have handrails.  Any raised decks and porches should have guardrails on the edges.  Have any leaves, snow, or ice cleared regularly.  Use sand or salt on walking paths during winter.  Clean up any spills in your garage right away. This includes oil or grease spills. What can I do in the bathroom?  Use night lights.  Install grab bars by the toilet and in the tub and shower. Do not use towel bars as grab bars.  Use non-skid mats or decals in the tub or shower.  If you need to sit down in the shower, use a plastic, non-slip stool.  Keep the floor dry. Clean up any water that spills on the floor as soon as it happens.  Remove soap buildup in the tub or shower regularly.  Attach bath mats securely with double-sided  non-slip rug tape.  Do not have throw rugs and other things on the floor that can make you trip. What can I do in the bedroom?  Use night lights.  Make sure that you have a light by your bed that is easy to reach.  Do not use any sheets or blankets that are too big for your bed. They should not hang down onto the floor.  Have a firm chair that has side arms. You can use this for support while you get dressed.  Do not have throw rugs and other things on the floor that can make you trip. What can I do in the kitchen?  Clean up any spills right away.  Avoid walking on wet floors.  Keep items that you use a lot in easy-to-reach places.  If you need to reach something above you, use a strong step stool that has a grab bar.  Keep electrical cords out of the way.  Do not use floor polish or wax that makes floors slippery. If you must use wax, use non-skid floor wax.  Do not have throw rugs and other things on the floor that can make you  trip. What can I do with my stairs?  Do not leave any items on the stairs.  Make sure that there are handrails on both sides of the stairs and use them. Fix handrails that are broken or loose. Make sure that handrails are as long as the stairways.  Check any carpeting to make sure that it is firmly attached to the stairs. Fix any carpet that is loose or worn.  Avoid having throw rugs at the top or bottom of the stairs. If you do have throw rugs, attach them to the floor with carpet tape.  Make sure that you have a light switch at the top of the stairs and the bottom of the stairs. If you do not have them, ask someone to add them for you. What else can I do to help prevent falls?  Wear shoes that:  Do not have high heels.  Have rubber bottoms.  Are comfortable and fit you well.  Are closed at the toe. Do not wear sandals.  If you use a stepladder:  Make sure that it is fully opened. Do not climb a closed stepladder.  Make sure that both  sides of the stepladder are locked into place.  Ask someone to hold it for you, if possible.  Clearly mark and make sure that you can see:  Any grab bars or handrails.  First and last steps.  Where the edge of each step is.  Use tools that help you move around (mobility aids) if they are needed. These include:  Canes.  Walkers.  Scooters.  Crutches.  Turn on the lights when you go into a dark area. Replace any light bulbs as soon as they burn out.  Set up your furniture so you have a clear path. Avoid moving your furniture around.  If any of your floors are uneven, fix them.  If there are any pets around you, be aware of where they are.  Review your medicines with your doctor. Some medicines can make you feel dizzy. This can increase your chance of falling. Ask your doctor what other things that you can do to help prevent falls. This information is not intended to replace advice given to you by your health care provider. Make sure you discuss any questions you have with your health care provider. Document Released: 07/24/2009 Document Revised: 03/04/2016 Document Reviewed: 11/01/2014 Elsevier Interactive Patient Education  2017 [employer].

## 2018-01-03 NOTE — Progress Notes (Signed)
Subjective:   Andrew [name] is a 72 y.o. male who presents for an Initial Medicare Annual Wellness Visit.  Review of Systems  N/A  Cardiac Risk Factors include: advanced age (>38men, >26 women);male gender;hypertension    Objective:    Today's Vitals   01/03/18 1038  BP: 136/64  Pulse: 84  Temp: 98.5 F (36.9 C)  TempSrc: Oral  Weight: 162 lb 6.4 oz (73.7 kg)  Height: 6' (1.829 m)  PainSc: 0-No pain   Body mass index is 22.03 kg/m.  Advanced Directives 01/03/2018 12/14/2015  Does Patient Have a Medical Advance Directive? Yes Yes  Type of Paramedic of [place];Living will Living will  Copy of [place] in Chart? Yes -    Current Medications (verified) Outpatient Encounter Medications as of 01/03/2018  Medication Sig  . aspirin EC 81 MG tablet Take 1 tablet (81 mg total) by mouth daily.  . cholecalciferol (VITAMIN D) 1000 UNITS tablet Take 1,000 Units by mouth daily.  . metoprolol succinate (TOPROL-XL) 25 MG 24 hr tablet Take 1 tablet (25 mg total) by mouth daily.  . Multiple Vitamin (MULTI-VITAMIN DAILY) TABS Take by mouth daily.   . saw palmetto 160 MG capsule Take 160 mg by mouth every other day.   . Turmeric 450 MG CAPS Take 450 mg by mouth daily.    No facility-administered encounter medications on file as of 01/03/2018.     Allergies (verified) Patient has no known allergies.   History: Past Medical History:  Diagnosis Date  . Atypical chest pain    a. Rest pain only; b. 08/2016 ETT: Ex time 51mins, no ST/T changes ->nl study.  . History of anxiety   . NSVT (nonsustained ventricular tachycardia) ([place])    a. 03/2017 Zio Monitor: 2 episodes of NSVT - up to 7 beats, max HR 207.  Marland Kitchen Palpitations    a. 03/2017 14 day Zio Monitor: PACs, PVCs, brief PSVT and NSVT-->managed w/ beta blocker.  . Precancerous lesion   . PSVT (paroxysmal supraventricular tachycardia) ([place])    a. 03/2017  Zio monitor: 7 episodes of PSVT - up to  6 beats, max rate 193.  . Valvular heart disease    a. 06/2017 Echo: EF 60-65%. No rwma. Mild MR. Mild to mod TR. PASP 54mmHg.   Past Surgical History:  Procedure Laterality Date  . EYE SURGERY Right    Done as a child  . VASECTOMY     Family History  Problem Relation Age of Onset  . Heart attack Father    Social History   Socioeconomic History  . Marital status: Married    Spouse name: Not on file  . Number of children: 0  . Years of education: Not on file  . Highest education level: Some college, no degree  Occupational History  . Occupation: Retired  Scientific laboratory technician  . Financial resource strain: Not hard at all  . Food insecurity:    Worry: Never true    Inability: Never true  . Transportation needs:    Medical: No    Non-medical: No  Tobacco Use  . Smoking status: Never Smoker  . Smokeless tobacco: Never Used  Substance and Sexual Activity  . Alcohol use: Yes    Alcohol/week: 2.4 oz    Types: 1 Glasses of wine, 1 Shots of liquor, 2 Standard drinks or equivalent per week  . Drug use: No  . Sexual activity: Not on file  Lifestyle  . Physical activity:  Days per week: Not on file    Minutes per session: Not on file  . Stress: Only a little  Relationships  . Social connections:    Talks on phone: Not on file    Gets together: Not on file    Attends religious service: Not on file    Active member of club or organization: Not on file    Attends meetings of clubs or organizations: Not on file    Relationship status: Not on file  Other Topics Concern  . Not on file  Social History Narrative  . Not on file   Tobacco Counseling Counseling given: Not Answered   Clinical Intake:  Pre-visit preparation completed: Yes  Pain : No/denies pain Pain Score: 0-No pain     Nutritional Status: BMI of 19-24  Normal Nutritional Risks: None Diabetes: No  How often do you need to have someone help you when you read instructions, pamphlets, or other written  materials from your doctor or pharmacy?: 1 - Never  Interpreter Needed?: No  Information entered by :: [facility], LPN  Activities of Daily Living In your present state of health, do you have any difficulty performing the following activities: 01/03/2018  Hearing? N  Vision? N  Difficulty concentrating or making decisions? N  Walking or climbing stairs? N  Dressing or bathing? N  Doing errands, shopping? N  Preparing Food and eating ? N  Using the Toilet? N  In the past six months, have you accidently leaked urine? N  Do you have problems with loss of bowel control? N  Managing your Medications? N  Managing your Finances? N  Housekeeping or managing your Housekeeping? N  Some recent data might be hidden     Immunizations and Health Maintenance Immunization History  Administered Date(s) Administered  . Hepatitis A, Adult 08/30/2017  . Influenza, High Dose Seasonal PF 08/09/2017  . Tdap 08/09/2017  . Typhoid Inactivated 08/30/2017   Health Maintenance Due  Topic Date Due  . Hepatitis C Screening  [birth year]  . PNA vac Low Risk Adult (1 of 2 - PCV13) 02/27/2013    Patient Care Team: Paulene Floor as PCP - General (Physician Assistant) End, Harrell Gave, MD as PCP - Cardiology (Cardiology) Theora Gianotti, NP as Nurse Practitioner (Nurse Practitioner) Brendolyn Patty, MD as Consulting Physician (Dermatology)  Indicate any recent Medical Services you may have received from other than [facility] providers in the past year (date may be approximate).    Assessment:   This is a routine wellness examination for Andrew [name].  Hearing/Vision screen Vision Screening Comments: Pt see Dr Margarite Gouge for vision checks yearly.   Dietary issues and exercise activities discussed: Current Exercise Habits: Home exercise routine, Type of exercise: walking, Time (Minutes): 20, Intensity: Mild  Goals    . Exercise 3x per week (30 min per time)     Recommend increasing exercise to 3 days a  week for at least 30 minutes.       Depression Screen PHQ 2/9 Scores 01/03/2018  PHQ - 2 Score 0    Fall Risk Fall Risk  01/03/2018  Falls in the past year? No    Is the patient's home free of loose throw rugs in walkways, pet beds, electrical cords, etc?   yes      Grab bars in the bathroom? no      Handrails on the stairs?   yes      Adequate lighting?   yes  Timed Get Up and  Go performed: N/A  Cognitive Function:     6CIT Screen 01/03/2018  What Year? 0 points  What month? 0 points  What time? 0 points  Count back from 20 0 points  Months in reverse 2 points  Repeat phrase 0 points  Total Score 2    Screening Tests Health Maintenance  Topic Date Due  . Hepatitis C Screening  29-Sep-1946  . PNA vac Low Risk Adult (1 of 2 - PCV13) 02/27/2013  . TETANUS/TDAP  05/30/2018  . COLONOSCOPY  05/28/2020  . INFLUENZA VACCINE  Completed    Qualifies for Shingles Vaccine? Due for Shingles vaccine. Declined my offer to administer today. Education has been provided regarding the importance of this vaccine. Pt has been advised to call her insurance company to determine her out of pocket expense. Advised she may also receive this vaccine at her local pharmacy or Health Dept. Verbalized acceptance and understanding.  Cancer Screenings: Lung: Low Dose CT Chest recommended if Age 20-80 years, 30 pack-year currently smoking OR have quit w/in 15years. Patient does not qualify. Colorectal: Up to date  Additional Screenings:  Hepatitis C Screening: Pt declines today.       Plan:  I have personally reviewed and addressed the Medicare Annual Wellness questionnaire and have noted the following in the patient's chart:  A. Medical and social history B. Use of alcohol, tobacco or illicit drugs  C. Current medications and supplements D. Functional ability and status E.  Nutritional status F.  Physical activity G. Advance directives H. List of other physicians I.  Hospitalizations,  surgeries, and ER visits in previous 12 months J.  Startup such as hearing and vision if needed, cognitive and depression L. Referrals and appointments - none  In addition, I have reviewed and discussed with patient certain preventive protocols, quality metrics, and best practice recommendations. A written personalized care plan for preventive services as well as general preventive health recommendations were provided to patient.  See attached scanned questionnaire for additional information.   Signed,  Fabio Neighbors, LPN Nurse Health Advisor   Nurse Recommendations: Pt declined the pneumonia vaccines today. Pt would like the Hepatitis C lab added to his yearly blood work orders when he comes in for his f/u on 01/10/18.

## 2018-01-10 ENCOUNTER — Encounter: Payer: Self-pay | Admitting: Physician Assistant

## 2018-01-10 ENCOUNTER — Ambulatory Visit (INDEPENDENT_AMBULATORY_CARE_PROVIDER_SITE_OTHER): Payer: Medicare Other | Admitting: Physician Assistant

## 2018-01-10 VITALS — BP 128/78 | HR 80 | Temp 98.3°F | Resp 16 | Ht 72.0 in | Wt 162.0 lb

## 2018-01-10 DIAGNOSIS — R7989 Other specified abnormal findings of blood chemistry: Secondary | ICD-10-CM | POA: Diagnosis not present

## 2018-01-10 DIAGNOSIS — K4090 Unilateral inguinal hernia, without obstruction or gangrene, not specified as recurrent: Secondary | ICD-10-CM | POA: Diagnosis not present

## 2018-01-10 DIAGNOSIS — G250 Essential tremor: Secondary | ICD-10-CM

## 2018-01-10 DIAGNOSIS — Z23 Encounter for immunization: Secondary | ICD-10-CM

## 2018-01-10 DIAGNOSIS — Z1159 Encounter for screening for other viral diseases: Secondary | ICD-10-CM

## 2018-01-10 DIAGNOSIS — E785 Hyperlipidemia, unspecified: Secondary | ICD-10-CM

## 2018-01-10 NOTE — Patient Instructions (Signed)
Essential Tremor A tremor is trembling or shaking that you cannot control. Most tremors affect the hands or arms. Tremors can also affect the head, vocal cords, face, and other parts of the body. Essential tremor is a tremor without a known cause. What are the causes? Essential tremor has no known cause. What increases the risk? You may be at greater risk of essential tremor if:  You have a family member with essential tremor.  You are age 70 or older.  You take certain medicines.  What are the signs or symptoms? The main sign of a tremor is uncontrolled and unintentional rhythmic shaking of a body part.  You may have difficulty eating with a spoon or fork.  You may have difficulty writing.  You may nod your head up and down or side to side.  You may have a quivering voice.  Your tremors:  May get worse over time.  May come and go.  May be more noticeable on one side of your body.  May get worse due to stress, fatigue, caffeine, and extreme heat or cold.  How is this diagnosed? Your health care provider can diagnose essential tremor based on your symptoms, medical history, and a physical examination. There is no single test to diagnose an essential tremor. However, your health care provider may perform a variety of tests to rule out other conditions. Tests may include:  Blood and urine tests.  Imaging studies of your brain, such as: ? CT scan. ? MRI.  A test that measures involuntary muscle movement (electromyogram).  How is this treated? Your tremors may go away without treatment. Mild tremors may not need treatment if they do not affect your day-to-day life. Severe tremors may need to be treated using one or a combination of the following options:  Medicines. This may include medicine that is injected.  Lifestyle changes.  Physical therapy.  Follow these instructions at home:  Take medicines only as directed by your health care provider.  Limit alcohol  intake to no more than 1 drink per day for nonpregnant women and 2 drinks per day for men. One drink equals 12 oz of beer, 5 oz of wine, or 1 oz of hard liquor.  Do not use any tobacco products, including cigarettes, chewing tobacco, or electronic cigarettes. If you need help quitting, ask your health care provider.  Take medicines only as directed by your health care provider.  Avoid extreme heat or cold.  Limit the amount of caffeine you consumeas directed by your health care provider.  Try to get eight hours of sleep each night.  Find ways to manage your stress, such as meditation or yoga.  Keep all follow-up visits as directed by your health care provider. This is important. This includes any physical therapy visits. Contact a health care provider if:  You experience any changes in the location or intensity of your tremors.  You start having a tremor after starting a new medicine.  You have tremor with other symptoms such as: ? Numbness. ? Tingling. ? Pain. ? Weakness.  Your tremor gets worse.  Your tremor interferes with your daily life. This information is not intended to replace advice given to you by your health care provider. Make sure you discuss any questions you have with your health care provider. Document Released: 10/18/2014 Document Revised: 03/04/2016 Document Reviewed: 03/25/2014 Elsevier Interactive Patient Education  2018 [employer]vier Inc[employer]  

## 2018-01-10 NOTE — Progress Notes (Signed)
Patient: Andrew [name] Male    DOB: 1946-09-07   72 y.o.   MRN: [id number] Visit Date: 01/10/2018  Today's Provider: Trinna Post, PA-C   Chief Complaint  Patient presents with  . Follow-up  . Eye Pain  . Hernia    possibly  . Tremors   Subjective:    HPI Patient comes in today for a follow up. Patient feels well with minor complaints.   He reports that he has had pain in his left eye for about 1 week. 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 He describes a grainy feeling in his eye at that point. Reports it hasn't been really bothersome. He denies any vision loss, redness, swelling, or blurred vision. He describes it as a discomfort.  He also mentions that he may have a hernia in his right groin area. He reports that it has been there for many years. However, he feels that his symptoms are getting worse. He reports small lump that has gotten bigger, especially when coughing. He does lift transmissions and exhausts. He describes it as a "pulling" sensation. No diarrhea. No abdominal pain. He would like to see a Psychologist, sport and exercise.   He also wanted to discuss a tremor in his right hand, present for ten years. He feels that it could be hereditary due to his mother, father, and brother having tremors. He reports that it only happens when he holds his hand a certain way. He reports that it is also happening more frequently. He notes his brother, who consumes alcohol frequently, seems to tremor less than everybody else. No difficulty with balance, swallowing speaking, never had stroke.   Does have history of slightly low TSH with normal T4 on follow up. Patient denies weight loss, chest pain, palpitations.      No Known Allergies   Current Outpatient Medications:  .  aspirin EC 81 MG tablet, Take 1 tablet (81 mg total) by mouth daily., Disp: 90 tablet, Rfl: 3 .  cholecalciferol (VITAMIN D) 1000 UNITS tablet, Take 1,000 Units by mouth daily., Disp:  , Rfl:  .  metoprolol succinate (TOPROL-XL) 25 MG 24 hr tablet, Take 1 tablet (25 mg total) by mouth daily., Disp: 90 tablet, Rfl: 3 .  Multiple Vitamin (MULTI-VITAMIN DAILY) TABS, Take by mouth daily. , Disp: , Rfl:  .  saw palmetto 160 MG capsule, Take 160 mg by mouth every other day. , Disp: , Rfl:  .  Turmeric 450 MG CAPS, Take 450 mg by mouth daily. , Disp: , Rfl:   Review of Systems  Constitutional: Negative for activity change, fatigue and unexpected weight change.  Eyes: Positive for pain and itching. Negative for photophobia, discharge, redness and visual disturbance.  Cardiovascular: Negative for chest pain, palpitations and leg swelling.  Gastrointestinal: Negative for abdominal distention, abdominal pain, anal bleeding, blood in stool and constipation.  Neurological: Positive for tremors. Negative for dizziness and headaches.    Social History   Tobacco Use  . Smoking status: Never Smoker  . Smokeless tobacco: Never Used  Substance Use Topics  . Alcohol use: Yes    Alcohol/week: 2.4 oz    Types: 1 Glasses of wine, 1 Shots of liquor, 2 Standard drinks or equivalent per week   Objective:   BP 128/78 (BP Location: Left Arm, Patient Position: Sitting, Cuff Size: Normal)   Pulse 80   Temp 98.3 F (36.8 C)  Resp 16   Ht 6' (1.829 m)   Wt 162 lb (73.5 kg)   BMI 21.97 kg/m  Vitals:   01/10/18 1010  BP: 128/78  Pulse: 80  Resp: 16  Temp: 98.3 F (36.8 C)  Weight: 162 lb (73.5 kg)  Height: 6' (1.829 m)     Physical Exam  Constitutional: He is oriented to person, place, and time.  Eyes: Pupils are equal, round, and reactive to light. Conjunctivae, EOM and lids are normal. Right eye exhibits no discharge. Left eye exhibits no discharge.  Lids everted and swept, there are some small particles in his lower lid which appear mobile.   Cardiovascular: Normal rate and regular rhythm.  Pulmonary/Chest: Effort normal and breath sounds normal.  Abdominal: Soft. Bowel  sounds are normal. A hernia is present. Hernia confirmed positive in the right inguinal area.  Neurological: He is alert and oriented to person, place, and time.  Symmetrical tremor in arms bilaterally, right > left. Tremor worsens with intentional motion.   Skin: Skin is warm and dry.  Psychiatric: He has a normal mood and affect. His behavior is normal.        Assessment & Plan:     1. Hyperlipidemia, unspecified hyperlipidemia type  - Lipid Profile  2. Encounter for hepatitis C screening test for low risk patient  - Hepatitis c antibody (reflex)  3. Low TSH level  Patient denying symptoms today, would like to wait on follow up lab.  4. Unilateral inguinal hernia without obstruction or gangrene, recurrence not specified  Counseled on return precautions, strangulation etc.   - Ambulatory referral to General Surgery  5. Essential tremor  Presentation consistent with essential tremor. Counseled that this is generally benign, beta blockers can be helpful but if he does not wish to do anything about it, he does not have to.  6. Need for vaccination against Streptococcus pneumoniae  - Pneumococcal conjugate vaccine 13-valent  I have spent 25 minutes with this patient, >50% of which was spent on counseling and coordination of care.  Return in about 1 year (around 01/11/2019).  The entirety of the information documented in the History of Present Illness, Review of Systems and Physical Exam were personally obtained by me. Portions of this information were initially documented by Ashley Royalty, CMA and reviewed by me for thoroughness and accuracy.         Trinna Post, PA-C  [facility]

## 2018-01-11 ENCOUNTER — Encounter: Payer: Self-pay | Admitting: General Surgery

## 2018-01-11 LAB — HEPATITIS C ANTIBODY (REFLEX): HCV Ab: <0.1 s/co ratio (ref 0.0–0.9)

## 2018-01-11 LAB — LIPID PANEL
Chol/HDL Ratio: 3.8 ratio (ref 0.0–5.0)
Cholesterol, Total: 183 mg/dL (ref 100–199)
HDL: 48 mg/dL (ref >39)
LDL Calculated: 120 mg/dL — ABNORMAL HIGH (ref 0–99)
Triglycerides: 77 mg/dL (ref 0–149)
VLDL Cholesterol Cal: 15 mg/dL (ref 5–40)

## 2018-01-11 LAB — HCV COMMENT:

## 2018-01-18 ENCOUNTER — Telehealth: Payer: Self-pay

## 2018-01-18 DIAGNOSIS — T1512XA Foreign body in conjunctival sac, left eye, initial encounter: Secondary | ICD-10-CM | POA: Diagnosis not present

## 2018-01-18 NOTE — Telephone Encounter (Signed)
This encounter was created in error - please disregard.

## 2018-01-18 NOTE — Telephone Encounter (Signed)
Completed 01/03/18. -MM

## 2018-02-07 ENCOUNTER — Ambulatory Visit: Payer: Self-pay | Admitting: General Surgery

## 2018-02-16 ENCOUNTER — Encounter: Payer: Self-pay | Admitting: General Surgery

## 2018-02-16 ENCOUNTER — Ambulatory Visit (INDEPENDENT_AMBULATORY_CARE_PROVIDER_SITE_OTHER): Payer: Medicare Other | Admitting: General Surgery

## 2018-02-16 VITALS — BP 126/76 | HR 80 | Resp 14 | Ht 72.0 in | Wt 164.0 lb

## 2018-02-16 DIAGNOSIS — K4090 Unilateral inguinal hernia, without obstruction or gangrene, not specified as recurrent: Secondary | ICD-10-CM

## 2018-02-16 NOTE — Patient Instructions (Addendum)
The patient is aware to call back for any questions or concerns.  Inguinal Hernia, Adult An inguinal hernia is when fat or the intestines push through the area where the leg meets the lower belly (groin) and make a rounded lump (bulge). This condition happens over time. There are three types of inguinal hernias. These types include:  Hernias that can be pushed back into the belly (are reducible).  Hernias that cannot be pushed back into the belly (are incarcerated).  Hernias that cannot be pushed back into the belly and lose their blood supply (get strangulated). This type needs emergency surgery.  Follow these instructions at home: Lifestyle  Drink enough fluid to keep your urine (pee) clear or pale yellow.  Eat plenty of fruits, vegetables, and whole grains. These have a lot of fiber. Talk with your doctor if you have questions.  Avoid lifting heavy objects.  Avoid standing for long periods of time.  Do not use tobacco products. These include cigarettes, chewing tobacco, or e-cigarettes. If you need help quitting, ask your doctor.  Try to stay at a healthy weight. General instructions  Do not try to force the hernia back in.  Watch your hernia for any changes in color or size. Let your doctor know if there are any changes.  Take over-the-counter and prescription medicines only as told by your doctor.  Keep all follow-up visits as told by your doctor. This is important. Contact a doctor if:  You have a fever.  You have new symptoms.  Your symptoms get worse. Get help right away if:  The area where the legs meets the lower belly has: ? Pain that gets worse suddenly. ? A bulge that gets bigger suddenly and does not go down. ? A bulge that turns red or purple. ? A bulge that is painful to the touch.  You are a man and your scrotum: ? Suddenly feels painful. ? Suddenly changes in size.  You feel sick to your stomach (nauseous) and this feeling does not go  away.  You throw up (vomit) and this keeps happening.  You feel your heart beating a lot more quickly than normal.  You cannot poop (have a bowel movement) or pass gas. This information is not intended to replace advice given to you by your health care provider. Make sure you discuss any questions you have with your health care provider. Document Released: 10/28/2006 Document Revised: 03/04/2016 Document Reviewed: 08/07/2014 Elsevier Interactive Patient Education  2018 [employer].

## 2018-02-16 NOTE — Progress Notes (Signed)
Patient ID: Andrew [name], male   DOB: 1946/08/14, 72 y.o.   MRN: [id number]  Chief Complaint  Patient presents with  . Hernia    HPI Andrew [name] is a 72 y.o. male.  Patient here today for an evaluation of a right inguinal hernia referred by Carles Collet PA.  He states that he has has noticed a knot for about 5-6 years.  It does seem to be causing some abdominal pain at random, especially when working on cars or push mowing the yard  No nausea, vomiting, constipation or diarrhea noted.  He is here with his wife of 41 years, Andrew [name]. He is a retired Personal assistant.  HPI  Past Medical History:  Diagnosis Date  . Atypical chest pain    a. Rest pain only; b. 08/2016 ETT: Ex time 74mins, no ST/T changes ->nl study.  . History of anxiety   . NSVT (nonsustained ventricular tachycardia) ([place])    a. 03/2017 Zio Monitor: 2 episodes of NSVT - up to 7 beats, max HR 207.  Marland Kitchen Palpitations    a. 03/2017 14 day Zio Monitor: PACs, PVCs, brief PSVT and NSVT-->managed w/ beta blocker.  . Precancerous lesion   . PSVT (paroxysmal supraventricular tachycardia) ([place])    a. 03/2017  Zio monitor: 7 episodes of PSVT - up to 6 beats, max rate 193.  . Valvular heart disease    a. 06/2017 Echo: EF 60-65%. No rwma. Mild MR. Mild to mod TR. PASP 23mmHg.    Past Surgical History:  Procedure Laterality Date  . EYE SURGERY Right    Done as a child  . VASECTOMY     30 yrs ago?    Family History  Problem Relation Age of Onset  . Heart attack Father     Social History Social History   Tobacco Use  . Smoking status: Never Smoker  . Smokeless tobacco: Never Used  Substance Use Topics  . Alcohol use: Yes    Alcohol/week: 2.4 oz    Types: 1 Glasses of wine, 1 Shots of liquor, 2 Standard drinks or equivalent per week  . Drug use: No    No Known Allergies  Current Outpatient Medications  Medication Sig Dispense Refill  . aspirin EC 81 MG tablet Take 1 tablet (81 mg total) by mouth daily. 90 tablet 3  .  cholecalciferol (VITAMIN D) 1000 UNITS tablet Take 1,000 Units by mouth daily.    . metoprolol succinate (TOPROL-XL) 25 MG 24 hr tablet Take 1 tablet (25 mg total) by mouth daily. 90 tablet 3  . Multiple Vitamin (MULTI-VITAMIN DAILY) TABS Take 1 tablet by mouth daily.     . saw palmetto 160 MG capsule Take 160 mg by mouth every other day.     . Turmeric 450 MG CAPS Take 450 mg by mouth daily.      No current facility-administered medications for this visit.     Review of Systems Review of Systems  Constitutional: Negative.   Respiratory: Negative.   Cardiovascular: Negative.   Gastrointestinal: Negative for constipation and diarrhea.    Blood pressure 126/76, pulse 80, resp. rate 14, height 6' (1.829 m), weight 164 lb (74.4 kg).  Physical Exam Physical Exam  Constitutional: He is oriented to person, place, and time. He appears well-developed and well-nourished.  HENT:  Mouth/Throat: No oropharyngeal exudate.  Eyes: Conjunctivae are normal. No scleral icterus.  Neck: Normal range of motion. Neck supple.  Cardiovascular: Normal rate, regular rhythm and normal heart sounds.  Pulmonary/Chest: Effort normal and breath sounds normal.  Abdominal: Soft. Normal appearance. A hernia is present. Hernia confirmed positive in the right inguinal area. Hernia confirmed negative in the left inguinal area.  Medium size inguinal hernia   Neurological: He is alert and oriented to person, place, and time.  Skin: Skin is warm and dry.  Psychiatric: His behavior is normal.    Data Reviewed Cardiology notes of November 29, 2017. PCP notes of January 10, 2018.. Cologuard May 28, 2017: Negative.  CBC of Feb 24, 2017 notable for a platelet count of 121,000.  Hemoglobin 14.1, MCV 91.8.  Comprehensive metabolic panel normal.  Assessment    Symptomatic right inguinal hernia.  Mild thrombocytopenia.      Plan    Hernia precautions and incarceration were discussed with the patient. If they develop  symptoms of an incarcerated hernia, they were encouraged to seek prompt medical attention.  I have recommended repair of the hernia using mesh on an outpatient basis in the near future. The risk of infection was reviewed. The role of prosthetic mesh to minimize the risk of recurrence was reviewed.  We will arrange for repeat platelet count prior to surgery to be sure this is not an acceptable trend.  HPI, Physical Exam, Assessment and Plan have been scribed under the direction and in the presence of Robert Bellow, MD. Karie Fetch, RN  I have completed the exam and reviewed the above documentation for accuracy and completeness.  I agree with the above.  Haematologist has been used and any errors in dictation or transcription are unintentional.  Hervey Ard, M.D., F.A.C.S.  Andrew [name] 02/19/2018, 7:52 AM  Patient's surgery has been scheduled for 03-01-18 at [facility]. It is okay for patient to continue an 81 mg aspirin once daily.   Dominga Ferry, CMA

## 2018-02-19 ENCOUNTER — Other Ambulatory Visit: Payer: Self-pay | Admitting: General Surgery

## 2018-02-19 DIAGNOSIS — K4090 Unilateral inguinal hernia, without obstruction or gangrene, not specified as recurrent: Secondary | ICD-10-CM

## 2018-02-20 ENCOUNTER — Other Ambulatory Visit: Payer: Self-pay

## 2018-02-20 DIAGNOSIS — K4090 Unilateral inguinal hernia, without obstruction or gangrene, not specified as recurrent: Secondary | ICD-10-CM

## 2018-02-21 ENCOUNTER — Encounter
Admission: RE | Admit: 2018-02-21 | Discharge: 2018-02-21 | Disposition: A | Discharge status: Home / Self Care | Payer: Medicare Other | Source: Ambulatory Visit | Attending: General Surgery | Admitting: General Surgery

## 2018-02-21 ENCOUNTER — Other Ambulatory Visit: Payer: Self-pay

## 2018-02-21 HISTORY — DX: Headache: R51

## 2018-02-21 HISTORY — DX: Essential (primary) hypertension: I10

## 2018-02-21 HISTORY — DX: Headache, unspecified: R51.9

## 2018-02-21 NOTE — Pre-Procedure Instructions (Signed)
[facility]  EXERCISE TOLERANCE TEST (ETT)  Order# [id number]  Reading physician: Minna Merritts, MD Ordering physician: Wende Bushy, MD Study date: 08/13/16  Patient Information   Name MRN Description  PHILLIPPE ORLICK "[name]" [id number] 72 y.o. Male  Result Notes for Exercise Tolerance Test   Notes Recorded by Stana Bunting, RN on 08/17/2016 at 9:51 AM EST Reviewed results w/ pt.  Advised of Dr. Tora Kindred recommendation. Pt verbalizes understanding and will call back w/ any questions or concerns. ------  Notes Recorded by Wende Bushy, MD on 08/17/2016 at 9:31 AM EST No EKG changes concerning for ischemia. Overall normal stress test. Patient only exercised for 4 minutes. There may be an element of deconditioning.      Vitals   Height Weight BMI (Calculated)  6\' 1"  (1.854 m) 163 lb 12.8 oz (74.3 kg) 21.7  Study Highlights   Exercise stress test Resting EKG showing normal sinus rhythm, rate 97 bpm, no significant ST or T-wave changes Resting blood pressure 139/92 Regular Bruce protocol performed Total exercise time 4 minutes, target heart rate achieved 137 bpm, 90% of maximum predicted heart rate 5.9 METS PVCs noted No significant ST or T-wave changes at peak exercise or in recovery concerning for ischemia Heart rate down to 98 bpm at 3 minutes in recovery  Overall a normal exercise stress study Adequate exercise tolerance  Signed, Esmond Plants, MD, Ph.D [facility]   Stress Findings   ECG Baseline ECG exhibits normal sinus rhythm and with PVCs..  Stress Findings The patient exercised following the Bruce protocol.  The patient reported no symptoms during the stress test. The patient experienced no angina during the stress test.   Test was stopped per protocol.   Blood pressure and heart rate demonstrated a normal response to exercise. Blood pressure demonstrated a normal response to exercise. Overall, the patient's exercise capacity was normal.   Recovery  time: 2.5 minutes. The patient's response to exercise was adequate for diagnosis.  Stress Measurements   Baseline Vitals  Rest HR 99 bpm    Rest BP 139/92 mmHg    Exercise Time  Exercise duration (min) 4 min    Exercise duration (sec) 8 sec    Peak Stress Vitals  Peak HR 137 bpm    Peak BP 160/79 mmHg    Exercise Data  MPHR 152 bpm    Percent HR 90 %    Estimated workload 5.9 METS       Signed   Electronically signed by Minna Merritts, MD on 08/13/16 at 45 EDT  Report approved and finalized on 08/13/2016 1759  Imaging   Imaging Information  Order-Level Documents:   Scan on 08/13/2016 11:46 AM by Default, Provider, MD      Encounter-Level Documents:   There are no encounter-level documents.  Exam Information   Status Exam Begun  Exam Ended   Final [99] 08/13/2016 11:40 AM 08/13/2016 11:41 AM  External Result Report   External Result Report  Encounter Report   Patient Encounter Report

## 2018-02-21 NOTE — Pre-Procedure Instructions (Signed)
Andrew [name]  ECHO COMPLETE WO IMAGING ENHANCING AGENT  Order# [id number]  Reading physician: Minna Merritts, MD Ordering physician: [name], MD Study date: 06/17/17  Result Notes for ECHOCARDIOGRAM COMPLETE   Notes recorded by Vanessa Ralphs, RN on 06/20/2017 at 9:01 AM EDT Attempted to dial patient's number but received a "busy-like" signal. Pt has reviewed results in My Chart.  ------  Notes recorded by Vanessa Ralphs, RN on 06/20/2017 at 9:01 AM EDT Pt has reviewed results in My Chart.  ------  Notes recorded by [name], MD on 06/18/2017 at 10:51 PM EDT Please let Andrew [name] know that his echo shows normal heart pumping function. Mild to moderate leakage of the mitral and tricuspid valves was noted, which we will continue to follow clinically. His pulmonary artery pressures were upper normal. As we discussed that his recent clinic visit, I think it would be beneficial to consider a sleep study. However, we can readdress this when he follows up with in 6 months if he does not wish to proceed at this time.      Study Result   Result status: Final result                           *[place]                        [place], [place] [address]                            [phone]  ------------------------------------------------------------------- Transthoracic Echocardiography  Patient:    Andrew [name], Andrew [name] MR #:       [id number] Study Date: 06/17/2017 Gender:     M Age:        72 Height:     182.9 cm Weight:     72.8 kg BSA:        1.92 m^2 Pt. Status: Room:   ATTENDING    [name], MD  ORDERING     [name], MD  REFERRING    [name], MD  SONOGRAPHER  [facility]  PERFORMING   [facility], [place]  cc:  ------------------------------------------------------------------- LV EF: 60% -    65%  ------------------------------------------------------------------- History:   PMH:   Chest pain.  Palpitations and tachycardia.  Risk factors:  Lifelong nonsmoker. Hypertension.  ------------------------------------------------------------------- Study Conclusions  - Left ventricle: The cavity size was normal. There was mild focal   basal hypertrophy of the septum. Systolic function was normal.   The estimated ejection fraction was in the range of 60% to 65%.   Wall motion was normal; there were no regional wall motion   abnormalities. Left ventricular diastolic function parameters   were normal. - Mitral valve: There was mild regurgitation. - Left atrium: The atrium was at the upper limits of normal in   size. - Right ventricle: Systolic function was normal. - Tricuspid valve: There was mild-moderate regurgitation. - Pulmonary arteries: Systolic pressure was within the normal   range. PA peak pressure: 33 mm Hg (S).  ------------------------------------------------------------------- Labs, prior tests, procedures, and surgery: Echocardiography (March 2013).     EF was 60%.  ------------------------------------------------------------------- Study data:  Comparison was made to the study of March 2013.  Study status:  Routine.  Procedure:  The patient reported no pain pre or post test. Transthoracic echocardiography. Image quality was adequate.  Study completion:  There were no complications. Transthoracic echocardiography.  M-mode, complete 2D, spectral Doppler, and color Doppler.  Birthdate:  Patient birthdate: 03-09-1946.  Age:  Patient is 72 yr old.  Sex:  Gender: male. BMI: 21.8 kg/m^2.  Blood pressure:     120/70  Patient status: Outpatient.  Study date:  Study date: 06/17/2017. Study time: 10:38 AM.  -------------------------------------------------------------------  ------------------------------------------------------------------- Left ventricle:  The  cavity size was normal. There was mild focal basal hypertrophy of the septum. Systolic function was normal. The estimated ejection fraction was in the range of 60% to 65%. Wall motion was normal; there were no regional wall motion abnormalities. The transmitral flow pattern was normal. The deceleration time of the early transmitral flow velocity was normal. The pulmonary vein flow pattern was normal. The tissue Doppler parameters were normal. Left ventricular diastolic function parameters were normal.  ------------------------------------------------------------------- Aortic valve:   Trileaflet; normal thickness leaflets. Mobility was not restricted.  Doppler:  Transvalvular velocity was within the normal range. There was no stenosis. There was no regurgitation.   ------------------------------------------------------------------- Aorta:  Aortic root: The aortic root was normal in size.  ------------------------------------------------------------------- Mitral valve:   Structurally normal valve.   Mobility was not restricted.  Doppler:  Transvalvular velocity was within the normal range. There was no evidence for stenosis. There was mild regurgitation.    Peak gradient (D): 2 mm Hg.  ------------------------------------------------------------------- Left atrium:  The atrium was at the upper limits of normal in size.   ------------------------------------------------------------------- Right ventricle:  The cavity size was normal. Wall thickness was normal. Systolic function was normal.  ------------------------------------------------------------------- Pulmonic valve:    Structurally normal valve.   Cusp separation was normal.  Doppler:  Transvalvular velocity was within the normal range. There was no evidence for stenosis. There was no regurgitation.  ------------------------------------------------------------------- Tricuspid valve:   Structurally normal valve.     Doppler: Transvalvular velocity was within the normal range. There was mild-moderate regurgitation.  ------------------------------------------------------------------- Pulmonary artery:   The main pulmonary artery was normal-sized. Systolic pressure was within the normal range.  ------------------------------------------------------------------- Right atrium:  The atrium was normal in size.  ------------------------------------------------------------------- Pericardium:  There was no pericardial effusion.  ------------------------------------------------------------------- Systemic veins: Inferior vena cava: The vessel was normal in size.  ------------------------------------------------------------------- Measurements   Left ventricle                             Value        Reference  LV ID, ED, PLAX chordal                    51    mm     43 - 52  LV ID, ES, PLAX chordal                    26    mm     23 - 38  LV fx shortening, PLAX chordal             49    %      >=29  LV PW thickness, ED                        9     mm     ----------  IVS/LV PW ratio, ED  0.89         <=1.3  Stroke volume, 2D                          92    ml     ----------  Stroke volume/bsa, 2D                      48    ml/m^2 ----------  LV ejection fraction, 1-p A4C              62    %      ----------  LV end-diastolic volume, 2-p               69    ml     ----------  LV end-systolic volume, 2-p                28    ml     ----------  LV ejection fraction, 2-p                  60    %      ----------  Stroke volume, 2-p                         41    ml     ----------  LV end-diastolic volume/bsa, 2-p           36    ml/m^2 ----------  LV end-systolic volume/bsa, 2-p            14    ml/m^2 ----------  Stroke volume/bsa, 2-p                     21.4  ml/m^2 ----------  LV e&', lateral                             15.9  cm/s   ----------  LV E/e&', lateral                            4.85         ----------  LV e&', medial                              10.1  cm/s   ----------  LV E/e&', medial                            7.63         ----------  LV e&', average                             13    cm/s   ----------  LV E/e&', average                           5.93         ----------    Ventricular septum                         Value        Reference  IVS thickness, ED  8     mm     ----------    LVOT                                       Value        Reference  LVOT ID, S                                 23    mm     ----------  LVOT area                                  4.15  cm^2   ----------  LVOT mean velocity, S                      75.9  cm/s   ----------  LVOT VTI, S                                22.2  cm     ----------    Aorta                                      Value        Reference  Aortic root ID, ED                         33    mm     ----------    Left atrium                                Value        Reference  LA ID, A-P, ES                             40    mm     ----------  LA ID/bsa, A-P                             2.08  cm/m^2 <=2.2  LA volume, S                               63    ml     ----------  LA volume/bsa, S                           32.8  ml/m^2 ----------  LA volume, ES, 1-p A4C                     65.5  ml     ----------  LA volume/bsa, ES, 1-p A4C                 34.1  ml/m^2 ----------  LA volume, ES, 1-p A2C  59    ml     ----------  LA volume/bsa, ES, 1-p A2C                 30.7  ml/m^2 ----------    Mitral valve                               Value        Reference  Mitral E-wave peak velocity                77.1  cm/s   ----------  Mitral A-wave peak velocity                47.1  cm/s   ----------  Mitral deceleration time                   190   ms     150 - 230  Mitral peak gradient, D                    2     mm Hg  ----------  Mitral E/A ratio, peak                      1.6          ----------  Mitral maximal regurg velocity,            410   cm/s   ----------  PISA    Pulmonary arteries                         Value        Reference  PA pressure, S, DP                 (H)     33    mm Hg  <=30    Tricuspid valve                            Value        Reference  Tricuspid regurg peak velocity             276   cm/s   ----------  Tricuspid peak RV-RA gradient              30    mm Hg  ----------    Right atrium                               Value        Reference  RA ID, S-I, ES, A4C                        48.7  mm     34 - 49  RA area, ES, A4C                           17.4  cm^2   8.3 - 19.5  RA volume, ES, A/L                         49    ml     ----------  RA volume/bsa, ES, A/L  25.5  ml/m^2 ----------    Systemic veins                             Value        Reference  Estimated CVP                              3     mm Hg  ----------    Right ventricle                            Value        Reference  TAPSE                                      23.9  mm     ----------  RV pressure, S, DP                 (H)     33    mm Hg  <=30  RV s&', lateral, S                          14    cm/s   ----------  Legend: (L)  and  (H)  mark values outside specified reference range.  ------------------------------------------------------------------- Prepared and Electronically Authenticated by  Esmond Plants, MD, [facility] 2018-09-07T19:42:58  MERGE Images   Show images for ECHOCARDIOGRAM COMPLETE  Patient Information   Patient Name Andrew [name], Andrew [name] Sex Male DOB 1946-02-13 SSN [id number]  Reason for Exam  Priority: Routine  Dx: NSVT (nonsustained ventricular tachycardia) ([place]) [I47.2 (ICD-10-CM)]; Atypical chest pain [R07.89 (ICD-10-CM)]  Surgical History   Surgical History   No past medical history on file.    Other Surgical History   Procedure Laterality Date Comment Source  EYE SURGERY Right  Done as a child Provider   VASECTOMY   30 yrs ago? Provider    Performing Technologist/Nurse   Performing Technologist/Nurse: McFatter, Jarvis Newcomer                    Implants    No active implants to display in this view.  Order-Level Documents:   There are no order-level documents.  Encounter-Level Documents - 06/17/2017:   Electronic signature on 06/17/2017 10:31 AM - Signed      Signed   Electronically signed by Minna Merritts, MD on 06/17/17 at 1943 EDT  Printable Result Report   Result Report   External Result Report   External Result Report    Patient Result Comments   Viewed by Kary Kos on 06/20/2017 9:12 AM  Written by Vanessa Ralphs, RN on 06/20/2017 9:00 AM  Dear Andrew [name],   I was unable to reach you on the number listed. It seemed to give a busy-like signal.   Dr. Saunders Revel has reviewed your echocardiogram results (ultrasound of your heart). The results shows your heart pumping function is normal. There is mild to moderate leakage of the mitral and tricuspid valves, which we will continue to follow as they are stable at this time. Your pulmonary pressures were upper range of normal.   Dr. Saunders Revel thinks it would be beneficial to consider a sleep study. However, we can readdress this when  you follow up in 6 months if you do not wish to proceed at this time.   If you would like to schedule a sleep study or have any further questions, please call our office at [phone]. Otherwise, we will see you in 6 months.   Thanks,  Anderson Malta, RN

## 2018-02-21 NOTE — Patient Instructions (Addendum)
Your procedure is scheduled on: 03-01-18 [facility] Report to Same Day Surgery 2nd floor medical mall [facility] Entrance-take elevator on left to 2nd floor.  Check in with surgery information desk.) To find out your arrival time please call [phone] between 1PM - 3PM on 02-28-18 TUESDAY  Remember: Instructions that are not followed completely may result in serious medical risk, up to and including death, or upon the discretion of your surgeon and anesthesiologist your surgery may need to be rescheduled.    _x___ 1. Do not eat food after midnight the night before your procedure. NO GUM OR CANDY AFTER MIDNIGHT.  You may drink clear liquids up to 2 hours before you are scheduled to arrive at the hospital for your procedure.  Do not drink clear liquids within 2 hours of your scheduled arrival to the hospital.  Clear liquids include  --Water or Apple juice without pulp  --Clear carbohydrate beverage such as ClearFast or Gatorade  --Black Coffee or Clear Tea (No milk, no creamers, do not add anything to the coffee or Tea      __x__ 2. No Alcohol for 24 hours before or after surgery.   __x__3. No Smoking or e-cigarettes for 24 prior to surgery.  Do not use any chewable tobacco products for at least 6 hour prior to surgery   ____  4. Bring all medications with you on the day of surgery if instructed.    __x__ 5. Notify your doctor if there is any change in your medical condition     (cold, fever, infections).    x___6. On the morning of surgery brush your teeth with toothpaste and water.  You may rinse your mouth with mouth wash if you wish.  Do not swallow any toothpaste or mouthwash.   Do not wear jewelry, make-up, hairpins, clips or nail polish.  Do not wear lotions, powders, or perfumes. You may wear deodorant.  Do not shave 48 hours prior to surgery. Men may shave face and neck.  Do not bring valuables to the hospital.    [facility] is not responsible for any belongings or  valuables.               Contacts, dentures or bridgework may not be worn into surgery.  Leave your suitcase in the car. After surgery it may be brought to your room.  For patients admitted to the hospital, discharge time is determined by your treatment team.  _  Patients discharged the day of surgery will not be allowed to drive home.  You will need someone to drive you home and stay with you the night of your procedure.   _x___ TAKE THE FOLLOWING MEDICATION THE MORNING OF SURGERY. These include:  1. METOPROLOL  2.  3.  4.  5.  6.  ____Fleets enema or Magnesium Citrate as directed.   _x___ Use CHG Soap or sage wipes as directed on instruction sheet   ____ Use inhalers on the day of surgery and bring to hospital day of surgery  ____ Stop Metformin and Janumet 2 days prior to surgery.    ____ Take 1/2 of usual insulin dose the night before surgery and none on the morning surgery.   _x___ Follow recommendations from Cardiologist, Pulmonologist or PCP regarding  stopping Aspirin, Coumadin, Plavix ,Eliquis, Effient, or Pradaxa, and Pletal-OK TO CONTINUE 81 MG ASPIRIN PER DR BYRNETT-DO NOT TAKE AM OF SURGERY  X____Stop Anti-inflammatories such as Advil, Aleve, Ibuprofen, Motrin, Naproxen, Naprosyn, Goodies powders or aspirin  products -OK to take Tylenol   _x___ Stop supplements until after surgery-STOP TURMERIC AND SAW PALMETTO NOW-MAY RESUME AFTER SURGERY   ____ Bring C-Pap to the hospital.

## 2018-02-22 ENCOUNTER — Telehealth: Payer: Self-pay | Admitting: *Deleted

## 2018-02-22 NOTE — Telephone Encounter (Signed)
It would be best to postpone elective hernia repair until oral abscess/ fistula resolved. Less risk of infection at the surgical site.

## 2018-02-22 NOTE — Telephone Encounter (Signed)
Patient called the office to report that he went to the dentist and they found an infection on his back molar and patient will require a root canal. The patient was told that it was advanced and that he has a fistula which is draining. The patient states that the only reason they found this was because they did x-rays at his office visit. Patient states he did not notice any symptoms. He is not currently on antibiotics and has been referred to an oral surgeon which he has yet to make an appointment with.   The patient is scheduled for right open inguinal hernia repair for 03-01-18 at [facility] with Dr. Bary Castilla.   Message to Dr. Bary Castilla to see if we need to put hernia surgery on hold until after root canal.

## 2018-02-22 NOTE — Telephone Encounter (Signed)
Surgery scheduled for 03-01-18 at [facility] with Dr. Bary Castilla has been cancelled.   [facility] Has been notified of cancellation.   The patient had his phone interview completed on 02-21-18 and was instructed to stop by for labs tomorrow. Caryl Pina at the [facility] has been notified that patient will not be stopping by as scheduled and that surgery has been cancelled at present.   The patient is aware to contact the office once oral abscess/fistula is resolved to reschedule surgery. He verbalizes understanding.

## 2018-02-23 ENCOUNTER — Telehealth: Payer: Self-pay | Admitting: *Deleted

## 2018-02-23 ENCOUNTER — Telehealth: Payer: Self-pay | Admitting: Internal Medicine

## 2018-02-23 NOTE — Telephone Encounter (Signed)
Patient on aspirin. Routing to Dr End for clearance.

## 2018-02-23 NOTE — Telephone Encounter (Signed)
° °  [facility] Pre-operative Risk Assessment    Request for surgical clearance:  1. What type of surgery is being performed?  Right inguinal hernia repair with prosthetic mesh if indicated  2. When is this surgery scheduled? Being r/s - needs clearance to proceed  3. What type of clearance is required (medical clearance vs. Pharmacy clearance to hold med vs. Both)?   4. Are there any medications that need to be held prior to surgery and how long?   5. Practice name and name of physician performing surgery? Dr. Hervey Ard    6. What is your office phone number (pre-admit) [phone]    7.   What is your office fax number (pre-admit) [phone]  8.   Anesthesia type (None, local, MAC, general) ?   Ace Gins 02/23/2018, 3:12 PM  _________________________________________________________________  Patient is needing to r/s surgery - has been having left side chest pain - needs to know if it is ok to proceed with process

## 2018-02-23 NOTE — Telephone Encounter (Signed)
Per Nira Conn in the Pre-admission Testing Department, patient reported chest pain last week and anesthesia is requesting that Andrew Hodgkins, NP with [facility] give the okay for patient to have surgery. Patient is an existing patient with him.  Patient's hernia surgery is currently on hold due to oral abscess/ fistula.   We will need cardiac approval before rescheduling surgery once oral abscess is resolved.

## 2018-02-24 NOTE — Telephone Encounter (Signed)
Pt was last seen by Ignacia Bayley, NP, in 11/2017.  He was doing well at that time.  As long as no new symptoms have developed, it is reasonable for him to proceed with inguinal hernia repair without additional cardiac testing/intervention.  Aspirin can be held 5-7 before the surgery at the discretion of his surgeon.  [name], MD [facility] Pager: [phone]

## 2018-02-24 NOTE — Telephone Encounter (Signed)
No answer. Left message to call back.   

## 2018-02-27 ENCOUNTER — Other Ambulatory Visit: Payer: Self-pay | Admitting: *Deleted

## 2018-02-27 NOTE — Telephone Encounter (Signed)
Pt returning our call °Please call back ° °

## 2018-02-27 NOTE — Telephone Encounter (Signed)
Spoke with patient and reviewed recommendations and he denied any new changes since last visit. Will route clearance to number listed and he had no further questions at this time.

## 2018-03-01 ENCOUNTER — Ambulatory Visit: Admission: RE | Admit: 2018-03-01 | Payer: Medicare Other | Source: Ambulatory Visit | Admitting: General Surgery

## 2018-03-01 ENCOUNTER — Telehealth: Payer: Self-pay | Admitting: *Deleted

## 2018-03-01 ENCOUNTER — Encounter: Admission: RE | Payer: Self-pay | Source: Ambulatory Visit

## 2018-03-01 SURGERY — REPAIR, HERNIA, INGUINAL, ADULT
Anesthesia: Choice | Laterality: Right

## 2018-03-01 NOTE — Telephone Encounter (Addendum)
Patient's wife called the office to report that patient would like to get surgery rescheduled. He had a root canal completed and oral surgeon told them he should be okay in 2-3 days. Patient does not have a follow up appointment with the oral surgeon but does with his dentist for a regular filling on 03-15-18.   Patient's right open inguinal hernia repair has been rescheduled for 03-20-18 at [facility]. It is okay for patient to continue an 81 mg aspirin once daily.   Cardiac clearance was received from Dr. Harrell Gave End at [facility]. See note from 02-23-18 in Epic.

## 2018-03-02 ENCOUNTER — Other Ambulatory Visit: Payer: Self-pay | Admitting: General Surgery

## 2018-03-02 DIAGNOSIS — K4090 Unilateral inguinal hernia, without obstruction or gangrene, not specified as recurrent: Secondary | ICD-10-CM

## 2018-03-13 ENCOUNTER — Encounter
Admission: RE | Admit: 2018-03-13 | Discharge: 2018-03-13 | Disposition: A | Discharge status: Home / Self Care | Payer: Medicare Other | Source: Ambulatory Visit | Attending: General Surgery | Admitting: General Surgery

## 2018-03-13 DIAGNOSIS — I471 Supraventricular tachycardia: Secondary | ICD-10-CM | POA: Insufficient documentation

## 2018-03-13 DIAGNOSIS — R079 Chest pain, unspecified: Secondary | ICD-10-CM | POA: Insufficient documentation

## 2018-03-13 DIAGNOSIS — Z01818 Encounter for other preprocedural examination: Secondary | ICD-10-CM | POA: Insufficient documentation

## 2018-03-13 LAB — BASIC METABOLIC PANEL
Anion gap: 9 (ref 5–15)
BUN: 16 mg/dL (ref 6–20)
CALCIUM: 9 mg/dL (ref 8.9–10.3)
CO2: 24 mmol/L (ref 22–32)
CREATININE: 0.87 mg/dL (ref 0.61–1.24)
Chloride: 106 mmol/L (ref 101–111)
GFR calc non Af Amer: >60 mL/min (ref >60)
Glucose, Bld: 89 mg/dL (ref 65–99)
Potassium: 4.2 mmol/L (ref 3.5–5.1)
SODIUM: 139 mmol/L (ref 135–145)

## 2018-03-13 LAB — CBC
HEMATOCRIT: 39.8 % — AB (ref 40.0–52.0)
Hemoglobin: 13.7 g/dL (ref 13.0–18.0)
MCH: 31.5 pg (ref 26.0–34.0)
MCHC: 34.4 g/dL (ref 32.0–36.0)
MCV: 91.5 fL (ref 80.0–100.0)
Platelets: 120 10*3/uL — ABNORMAL LOW (ref 150–440)
RBC: 4.35 MIL/uL — ABNORMAL LOW (ref 4.40–5.90)
RDW: 12.4 % (ref 11.5–14.5)
WBC: 4.2 10*3/uL (ref 3.8–10.6)

## 2018-03-16 NOTE — Pre-Procedure Instructions (Signed)
Cardiac Clearance on chart 

## 2018-03-16 NOTE — Pre-Procedure Instructions (Signed)
Theora Gianotti, NP  Nurse Practitioner  Cardiology  Progress Notes  Signed  Encounter Date:  11/29/2017          Signed            Show:Clear all [x] Manual[x] Template[] Copied  Added by: [x] Theora Gianotti, NP   [] Hover for details      Office Visit    Patient Name: Andrew [name] Date of Encounter: 11/29/2017  Primary Care Provider:  Trinna Post, PA-C Primary Cardiologist:  [name], MD  Chief Complaint    72 y/o ? with a history of atypical chest pain, palpitations (PSVT and NSVT) and valvular heart disease, who presents for follow-up.  Past Medical History        Past Medical History:  Diagnosis Date  . Atypical chest pain    a. Rest pain only; b. 08/2016 ETT: Ex time 56mins, no ST/T changes ->nl study.  . History of anxiety   . NSVT (nonsustained ventricular tachycardia) ([place])    a. 03/2017 Zio Monitor: 2 episodes of NSVT - up to 7 beats, max HR 207.  Marland Kitchen Palpitations    a. 03/2017 14 day Zio Monitor: PACs, PVCs, brief PSVT and NSVT-->managed w/ beta blocker.  Marland Kitchen PSVT (paroxysmal supraventricular tachycardia) ([place])    a. 03/2017  Zio monitor: 7 episodes of PSVT - up to 6 beats, max rate 193.  . Valvular heart disease    a. 06/2017 Echo: EF 60-65%. No rwma. Mild MR. Mild to mod TR. PASP 32mmHg.        Past Surgical History:  Procedure Laterality Date  . EYE SURGERY Right    Done as a child  . VASECTOMY      Allergies  No Known Allergies  History of Present Illness    72 year old male with a history of atypical chest pain, palpitations, and anxiety.  He previously underwent evaluation for atypical chest pain in November 2017 with exercise treadmill testing.  He exercised for 4 minutes and did not have any symptoms or ST/T changes.  Test was stopped because he had target.  He says now, that he felt like he could exercise much longer.  More recently, he was evaluated for palpitations with a  14-day monitor in June 2018.  This revealed PACs, PVCs, and a few brief runs of PSVT and 2 brief runs of nonsustained VT.  He did not have any reported symptoms associated with these arrhythmias.  He was maintained on beta-blocker therapy and followed up with Dr. Saunders Revel in August.  He was doing relatively well at that time.  An echocardiogram was performed in September 2018 [place], showing normal LV function with mild mitral regurgitation and mild to moderate tricuspid regurgitation.  Pulmonary artery systolic pressure was mildly elevated at 33 mmHg.  As he had a history of snoring and there was some concern for sleep apnea at his prior visit, rec mentation was made for sleep study however patient deferred.  Since his last visit in September, he has done reasonably well.  He remains active but is not exercising necessarily.  He occasionally has a brief or fleeting episode of chest discomfort that only occurs at rest, lasts a few minutes, and resolve spontaneously.  He has never had exertional symptoms.  He and his wife walk fairly regularly without any significant limitations.  He feels as though his palpitations have been well controlled and he tolerates metoprolol well.  He denies PND, orthopnea, dizziness, syncope, edema, or early satiety.  Home Medications  Prior to Admission medications   Medication Sig Start Date End Date Taking? Authorizing Provider  aspirin EC 81 MG tablet Take 1 tablet (81 mg total) by mouth daily. 07/19/14  Yes Nahser, Wonda Cheng, MD  cholecalciferol (VITAMIN D) 1000 UNITS tablet Take 1,000 Units by mouth daily.   Yes [provider]  metoprolol succinate (TOPROL-XL) 25 MG 24 hr tablet Take 1 tablet (25 mg total) by mouth daily. 11/29/17  Yes Theora Gianotti, NP  Multiple Vitamin (MULTI-VITAMIN DAILY) TABS Take by mouth.   Yes [provider]  saw palmetto 160 MG capsule Take 160 mg by mouth every other day.    Yes [provider]   Turmeric 450 MG CAPS Take 450 mg by mouth every other day.   Yes [provider]    Review of Systems    Overall doing well.  Occasional, brief episodes of chest discomfort.  He denies dyspnea, PND, orthopnea, dizziness, syncope, edema, or early satiety.  All other systems reviewed and are otherwise negative except as noted above.  Physical Exam    VS:  BP 122/80 (BP Location: Left Arm, Patient Position: Sitting, Cuff Size: Normal)   Pulse 73   Ht 6' (1.829 m)   Wt 163 lb (73.9 kg)   BMI 22.11 kg/m  , BMI Body mass index is 22.11 kg/m. GEN: Well nourished, well developed, in no acute distress.  HEENT: normal.  Neck: Supple, no JVD, carotid bruits, or masses. Cardiac: RRR, no murmurs, rubs, or gallops. No clubbing, cyanosis, edema.  Radials/DP/PT 2+ and equal bilaterally.  Respiratory:  Respirations regular and unlabored, clear to auscultation bilaterally. GI: Soft, nontender, nondistended, BS + x 4. MS: no deformity or atrophy. Skin: warm and dry, no rash. Neuro:  Strength and sensation are intact. Psych: Normal affect.  Accessory Clinical Findings    ECG -regular sinus rhythm, 73, no acute ST or T changes.  Assessment & Plan    1.  Atypical chest pain: This is somewhat long-standing and occurs intermittently at rest once a month or less.  He has never had exertional symptoms.  He had previously normal exercise treadmill test in late 2017.  Echocardiogram in September 2018 showed normal LV function.  Overall, he feels that symptoms are stable.  No further evaluation at this time.  2.  Palpitations/PSVT/nonsustained VT: Patient wore an event monitor in June 2018 revealing PACs, PVCs, PSVT, and nonsustained VT.  He has not been having significant palpitations and is tolerating beta-blocker well.  Echo showed normal LV function.  Continue beta blocker therapy.  3.  Valvular heart disease: Mild MR with mild to moderate TR on echo.  We will plan to follow-up in  about a year.  4.  Snoring/mild pulmonary hypertension: Patient is not currently interested in sleep study as he does not think he will be able to tolerate CPAP.   5.  Disposition: Patient has been doing well and wishes to follow-up in 1 year.   Murray Hodgkins, NP 11/29/2017, 4:32 PM           Electronically signed by Theora Gianotti, NP at 11/29/2017 4:38 PM     Office Visit on 11/29/2017        Detailed Report

## 2018-03-17 ENCOUNTER — Encounter: Payer: Self-pay | Admitting: Anesthesiology

## 2018-03-19 MED ORDER — CEFAZOLIN SODIUM-DEXTROSE 2-4 GM/100ML-% IV SOLN
2.0000 g | INTRAVENOUS | Status: AC
  Administered 2018-03-20: 2 g via INTRAVENOUS

## 2018-03-20 ENCOUNTER — Ambulatory Visit: Payer: Medicare Other | Admitting: Anesthesiology

## 2018-03-20 ENCOUNTER — Encounter: Payer: Self-pay | Admitting: *Deleted

## 2018-03-20 ENCOUNTER — Encounter
Admission: RE | Disposition: A | Discharge status: Home / Self Care | Payer: Self-pay | Source: Ambulatory Visit | Attending: General Surgery

## 2018-03-20 ENCOUNTER — Ambulatory Visit
Admission: RE | Admit: 2018-03-20 | Discharge: 2018-03-20 | Disposition: A | Discharge status: Home / Self Care | Payer: Medicare Other | Source: Ambulatory Visit | Attending: General Surgery | Admitting: General Surgery

## 2018-03-20 ENCOUNTER — Other Ambulatory Visit: Payer: Self-pay

## 2018-03-20 DIAGNOSIS — I471 Supraventricular tachycardia: Secondary | ICD-10-CM | POA: Insufficient documentation

## 2018-03-20 DIAGNOSIS — K4090 Unilateral inguinal hernia, without obstruction or gangrene, not specified as recurrent: Secondary | ICD-10-CM | POA: Diagnosis not present

## 2018-03-20 DIAGNOSIS — F419 Anxiety disorder, unspecified: Secondary | ICD-10-CM | POA: Diagnosis not present

## 2018-03-20 DIAGNOSIS — Z79899 Other long term (current) drug therapy: Secondary | ICD-10-CM | POA: Diagnosis not present

## 2018-03-20 DIAGNOSIS — I10 Essential (primary) hypertension: Secondary | ICD-10-CM | POA: Diagnosis not present

## 2018-03-20 HISTORY — PX: INGUINAL HERNIA REPAIR: SHX194

## 2018-03-20 SURGERY — REPAIR, HERNIA, INGUINAL, ADULT
Anesthesia: General | Site: Groin | Laterality: Right | Wound class: Clean

## 2018-03-20 MED ORDER — GABAPENTIN 300 MG PO CAPS
300.0000 mg | ORAL_CAPSULE | ORAL | Status: AC
  Administered 2018-03-20: 300 mg via ORAL

## 2018-03-20 MED ORDER — LACTATED RINGERS IV SOLN
INTRAVENOUS | Status: DC
  Administered 2018-03-20: 08:00:00 via INTRAVENOUS

## 2018-03-20 MED ORDER — CEFAZOLIN SODIUM-DEXTROSE 2-4 GM/100ML-% IV SOLN
INTRAVENOUS | Status: AC
  Filled 2018-03-20: qty 100

## 2018-03-20 MED ORDER — PROPOFOL 10 MG/ML IV BOLUS
INTRAVENOUS | Status: DC | PRN
  Administered 2018-03-20: 150 mg via INTRAVENOUS

## 2018-03-20 MED ORDER — DEXAMETHASONE SODIUM PHOSPHATE 10 MG/ML IJ SOLN
INTRAMUSCULAR | Status: DC | PRN
  Administered 2018-03-20: 6 mg via INTRAVENOUS

## 2018-03-20 MED ORDER — MIDAZOLAM HCL 2 MG/2ML IJ SOLN
INTRAMUSCULAR | Status: AC
  Filled 2018-03-20: qty 2

## 2018-03-20 MED ORDER — PROPOFOL 10 MG/ML IV BOLUS
INTRAVENOUS | Status: AC
  Filled 2018-03-20: qty 20

## 2018-03-20 MED ORDER — ROCURONIUM BROMIDE 100 MG/10ML IV SOLN
INTRAVENOUS | Status: DC | PRN
  Administered 2018-03-20: 45 mg via INTRAVENOUS

## 2018-03-20 MED ORDER — ONDANSETRON HCL 4 MG/2ML IJ SOLN: 4.0000 mg | Freq: Once | INTRAMUSCULAR | Status: DC | PRN

## 2018-03-20 MED ORDER — GABAPENTIN 300 MG PO CAPS
ORAL_CAPSULE | ORAL | Status: AC
  Filled 2018-03-20: qty 1

## 2018-03-20 MED ORDER — HYDROCODONE-ACETAMINOPHEN 5-325 MG PO TABS: 1.0000 | ORAL_TABLET | ORAL | 0 refills | Status: AC | PRN

## 2018-03-20 MED ORDER — KETOROLAC TROMETHAMINE 30 MG/ML IJ SOLN
INTRAMUSCULAR | Status: DC | PRN
  Administered 2018-03-20: 30 mg via INTRA_ARTICULAR

## 2018-03-20 MED ORDER — FAMOTIDINE 20 MG PO TABS: 20.0000 mg | ORAL_TABLET | Freq: Once | ORAL | Status: DC

## 2018-03-20 MED ORDER — BUPIVACAINE-EPINEPHRINE (PF) 0.5% -1:200000 IJ SOLN
INTRAMUSCULAR | Status: DC | PRN
  Administered 2018-03-20: 10 mL via PERINEURAL
  Administered 2018-03-20: 20 mL via PERINEURAL

## 2018-03-20 MED ORDER — LIDOCAINE HCL (CARDIAC) PF 100 MG/5ML IV SOSY
PREFILLED_SYRINGE | INTRAVENOUS | Status: DC | PRN
  Administered 2018-03-20: 80 mg via INTRAVENOUS

## 2018-03-20 MED ORDER — MIDAZOLAM HCL 2 MG/2ML IJ SOLN
INTRAMUSCULAR | Status: DC | PRN
  Administered 2018-03-20: 2 mg via INTRAVENOUS

## 2018-03-20 MED ORDER — FENTANYL CITRATE (PF) 100 MCG/2ML IJ SOLN
INTRAMUSCULAR | Status: DC | PRN
  Administered 2018-03-20: 100 ug via INTRAVENOUS

## 2018-03-20 MED ORDER — FENTANYL CITRATE (PF) 100 MCG/2ML IJ SOLN: 25.0000 ug | INTRAMUSCULAR | Status: DC | PRN

## 2018-03-20 MED ORDER — SUGAMMADEX SODIUM 200 MG/2ML IV SOLN
INTRAVENOUS | Status: DC | PRN
  Administered 2018-03-20: 200 mg via INTRAVENOUS

## 2018-03-20 MED ORDER — EPHEDRINE SULFATE 50 MG/ML IJ SOLN
INTRAMUSCULAR | Status: DC | PRN
  Administered 2018-03-20: 10 mg via INTRAVENOUS

## 2018-03-20 MED ORDER — FENTANYL CITRATE (PF) 100 MCG/2ML IJ SOLN
INTRAMUSCULAR | Status: AC
  Filled 2018-03-20: qty 2

## 2018-03-20 MED ORDER — BUPIVACAINE-EPINEPHRINE (PF) 0.5% -1:200000 IJ SOLN
INTRAMUSCULAR | Status: AC
  Filled 2018-03-20: qty 30

## 2018-03-20 MED ORDER — ONDANSETRON HCL 4 MG/2ML IJ SOLN
INTRAMUSCULAR | Status: DC | PRN
  Administered 2018-03-20: 4 mg via INTRAVENOUS

## 2018-03-20 SURGICAL SUPPLY — 33 items
BENZOIN TINCTURE PRP APPL 2/3 (GAUZE/BANDAGES/DRESSINGS) ×2 IMPLANT
BLADE SURG 15 STRL SS SAFETY (BLADE) ×4 IMPLANT
CANISTER SUCT 1200ML W/VALVE (MISCELLANEOUS) ×2 IMPLANT
CHLORAPREP W/TINT 26ML (MISCELLANEOUS) ×2 IMPLANT
DECANTER SPIKE VIAL GLASS SM (MISCELLANEOUS) ×2 IMPLANT
DRAIN PENROSE 1/4X12 LTX (DRAIN) ×2 IMPLANT
DRAPE LAPAROTOMY 100X77 ABD (DRAPES) ×2 IMPLANT
DRSG TEGADERM 4X4.75 (GAUZE/BANDAGES/DRESSINGS) ×2 IMPLANT
DRSG TELFA 4X3 1S NADH ST (GAUZE/BANDAGES/DRESSINGS) ×2 IMPLANT
ELECT REM PT RETURN 9FT ADLT (ELECTROSURGICAL) ×2
ELECTRODE REM PT RTRN 9FT ADLT (ELECTROSURGICAL) ×1 IMPLANT
GLOVE BIO SURGEON STRL SZ7.5 (GLOVE) ×2 IMPLANT
GLOVE INDICATOR 8.0 STRL GRN (GLOVE) ×2 IMPLANT
GOWN STRL REUS W/ TWL LRG LVL3 (GOWN DISPOSABLE) ×2 IMPLANT
GOWN STRL REUS W/TWL LRG LVL3 (GOWN DISPOSABLE) ×2
KIT TURNOVER KIT A (KITS) ×2 IMPLANT
LABEL OR SOLS (LABEL) ×2 IMPLANT
MESH HERNIA SYS ULTRAPRO LRG (Mesh General) ×2 IMPLANT
NEEDLE HYPO 22GX1.5 SAFETY (NEEDLE) ×4 IMPLANT
PACK BASIN MINOR ARMC (MISCELLANEOUS) ×2 IMPLANT
STRIP CLOSURE SKIN 1/2X4 (GAUZE/BANDAGES/DRESSINGS) ×2 IMPLANT
SUT PDS AB 0 CT1 27 (SUTURE) ×2 IMPLANT
SUT SURGILON 0 BLK (SUTURE) ×4 IMPLANT
SUT VIC AB 2-0 SH 27 (SUTURE) ×1
SUT VIC AB 2-0 SH 27XBRD (SUTURE) ×1 IMPLANT
SUT VIC AB 3-0 54X BRD REEL (SUTURE) ×1 IMPLANT
SUT VIC AB 3-0 BRD 54 (SUTURE) ×1
SUT VIC AB 3-0 SH 27 (SUTURE) ×1
SUT VIC AB 3-0 SH 27X BRD (SUTURE) ×1 IMPLANT
SUT VIC AB 4-0 FS2 27 (SUTURE) ×2 IMPLANT
SWABSTK COMLB BENZOIN TINCTURE (MISCELLANEOUS) ×2 IMPLANT
SYR 10ML LL (SYRINGE) ×2 IMPLANT
SYR 3ML LL SCALE MARK (SYRINGE) ×2 IMPLANT

## 2018-03-20 NOTE — Anesthesia Postprocedure Evaluation (Signed)
Anesthesia Post Note  Patient: Andrew [name]  Procedure(s) Performed: HERNIA REPAIR INGUINAL ADULT (Right Groin)  Patient location during evaluation: PACU Anesthesia Type: General Level of consciousness: awake and alert Pain management: pain level controlled Vital Signs Assessment: post-procedure vital signs reviewed and stable Respiratory status: spontaneous breathing, nonlabored ventilation, respiratory function stable and patient connected to nasal cannula oxygen Cardiovascular status: blood pressure returned to baseline and stable Postop Assessment: no apparent nausea or vomiting Anesthetic complications: no     Last Vitals:  Vitals:   03/20/18 0942 03/20/18 1018  BP: 134/72 125/62  Pulse: 84 84  Resp: 16   Temp: 36.4 C   SpO2: 100% 99%    Last Pain:  Vitals:   03/20/18 1018  TempSrc:   PainSc: 0-No pain                 Jarrad Mclees S

## 2018-03-20 NOTE — Transfer of Care (Signed)
Immediate Anesthesia Transfer of Care Note  Patient: Andrew [name]  Procedure(s) Performed: HERNIA REPAIR INGUINAL ADULT (Right Groin)  Patient Location: PACU  Anesthesia Type:General  Level of Consciousness: awake, alert  and oriented  Airway & Oxygen Therapy: Patient Spontanous Breathing and Patient connected to face mask oxygen  Post-op Assessment: Report given to RN and Post -op Vital signs reviewed and stable  Post vital signs: Reviewed and stable  Last Vitals:  Vitals Value Taken Time  BP 120/64 03/20/2018  8:43 AM  Temp    Pulse 92 03/20/2018  8:44 AM  Resp 12 03/20/2018  8:44 AM  SpO2 100 % 03/20/2018  8:44 AM  Vitals shown include unvalidated device data.  Last Pain:  Vitals:   03/20/18 0619  TempSrc: Oral  PainSc: 0-No pain         Complications: No apparent anesthesia complications

## 2018-03-20 NOTE — Anesthesia Procedure Notes (Signed)
Procedure Name: Intubation Date/Time: 03/20/2018 7:48 AM Performed by: Philbert Riser, CRNA Pre-anesthesia Checklist: Patient identified, Emergency Drugs available, Suction available, Patient being monitored and Timeout performed Patient Re-evaluated:Patient Re-evaluated prior to induction Oxygen Delivery Method: Circle system utilized and Simple face mask Preoxygenation: Pre-oxygenation with 100% oxygen Induction Type: IV induction Ventilation: Mask ventilation without difficulty Laryngoscope Size: Mac and 3 Grade View: Grade III Tube type: Oral Tube size: 7.5 mm Number of attempts: 2 Airway Equipment and Method: Bougie stylet Placement Confirmation: ETT inserted through vocal cords under direct vision,  positive ETCO2 and breath sounds checked- equal and bilateral Secured at: 22 cm Tube secured with: Tape Dental Injury: Teeth and Oropharynx as per pre-operative assessment  Difficulty Due To: Difficulty was unanticipated Future Recommendations: Recommend- induction with short-acting agent, and alternative techniques readily available

## 2018-03-20 NOTE — Anesthesia Post-op Follow-up Note (Signed)
Anesthesia QCDR form completed.        

## 2018-03-20 NOTE — Op Note (Signed)
Preoperative diagnosis: Symptomatic right inguinal hernia.  Postoperative diagnosis: Same, direct.  Operative procedure: Right inguinal hernia repair with large Ultra Pro mesh.  Operating Surgeon: Hervey Ard, MD.  Anesthesia: General endotracheal, Marcaine 0.5% with 1 to 200,000 units of epinephrine, 30 cc.  Toradol 30 mg.  Estimated blood loss: Less than 5 cc.  Clinical note: This 72 year old male has developed a symptomatic right inguinal hernia he is admitted for elective repair.  He received Kefzol prior to the procedure.  Hair was removed from the surgical site with clippers prior to presentation of the operating theater.  SCD stockings for DVT prevention.  Operative note: With the patient under adequate general endotracheal anesthesia per anesthesia preference, the area was prepped with ChloraPrep and draped.  Field block anesthesia was placed for postoperative analgesia.  A 5 cm skin line incision was made along the anticipated course of the inguinal canal and carried down through skin subtendinous tissue with hemostasis achieved by electrocautery.  The external oblique was opened in the direction of its fibers.  The cord was mobilized.  No indirect sac was noted.  There was a sizable direct defect extending from the medial aspect of the inferior epigastric vessels to the pubic tubercle.  The undersurface was cleared and a large Ultra Pro mesh was smooth into position.  The external component was laid along the floor the inguinal canal.  This was anchored to the pubic tubercle with 0 Surgilon.  The inferior aspect of the mesh was sewn to the inguinal ligament with similar 0 Surgilon sutures.  A lateral slit was made for cord passage.  The medial and superior borders were anchored to the transverse abdominis aponeurosis.  Toradol was placed in the wound.  The external Bleich was closed with a running 2-0 Vicryl suture.  Scarpa's fascia was closed with a running 3-0 Vicryl suture.  The skin  was closed with a running 4-0 Vicryl subcuticular suture.  Benzoin, Steri-Strips, Telfa and Tegaderm dressing applied.  The patient tolerated the procedure well was taken recovery room in stable condition.

## 2018-03-20 NOTE — Anesthesia Preprocedure Evaluation (Signed)
Anesthesia Evaluation  Patient identified by MRN, date of birth, ID band Patient awake    Reviewed: Allergy & Precautions, NPO status , Patient's Chart, lab work & pertinent test results, reviewed documented beta blocker date and time   Airway Mallampati: II  TM Distance: >3 FB     Dental  (+) Chipped   Pulmonary           Cardiovascular hypertension, Pt. on medications and Pt. on home beta blockers + dysrhythmias Supra Ventricular Tachycardia      Neuro/Psych  Headaches,    GI/Hepatic   Endo/Other    Renal/GU      Musculoskeletal   Abdominal   Peds  Hematology   Anesthesia Other Findings EKG and echo ok.  Reproductive/Obstetrics                             Anesthesia Physical Anesthesia Plan  ASA: III  Anesthesia Plan: General   Post-op Pain Management:    Induction: Intravenous  PONV Risk Score and Plan:   Airway Management Planned: LMA  Additional Equipment:   Intra-op Plan:   Post-operative Plan:   Informed Consent: I have reviewed the patients History and Physical, chart, labs and discussed the procedure including the risks, benefits and alternatives for the proposed anesthesia with the patient or authorized representative who has indicated his/her understanding and acceptance.     Plan Discussed with: CRNA  Anesthesia Plan Comments:         Anesthesia Quick Evaluation

## 2018-03-20 NOTE — H&P (Signed)
Andrew [name] [id number] Nov 14, 1945     HPI: Healthy 72 year old male with a sizable right inguinal hernia.  For elective repair.  No interval change in clinical symptoms.  Medications Prior to Admission  Medication Sig Dispense Refill Last Dose  . metoprolol succinate (TOPROL-XL) 25 MG 24 hr tablet Take 1 tablet (25 mg total) by mouth daily. (Patient taking differently: Take 25 mg by mouth every morning. ) 90 tablet 3 03/20/2018 at 0530  . Multiple Vitamin (MULTI-VITAMIN DAILY) TABS Take 1 tablet by mouth daily.    03/19/2018 at Unknown time  . aspirin EC 81 MG tablet Take 1 tablet (81 mg total) by mouth daily. 90 tablet 3 03/18/2018  . cholecalciferol (VITAMIN D) 1000 UNITS tablet Take 1,000 Units by mouth daily.   03/19/2018  . saw palmetto 160 MG capsule Take 160 mg by mouth every other day.    03/13/2018  . Turmeric 450 MG CAPS Take 450 mg by mouth daily.    03/13/2018   No Known Allergies Past Medical History:  Diagnosis Date  . Atypical chest pain    a. Rest pain only; b. 08/2016 ETT: Ex time 96mins, no ST/T changes ->nl study.  . Headache    H/O MIGRAINES  . History of anxiety   . Hypertension   . NSVT (nonsustained ventricular tachycardia) ([place])    a. 03/2017 Zio Monitor: 2 episodes of NSVT - up to 7 beats, max HR 207.  Marland Kitchen Palpitations    a. 03/2017 14 day Zio Monitor: PACs, PVCs, brief PSVT and NSVT-->managed w/ beta blocker.  . Precancerous lesion   . PSVT (paroxysmal supraventricular tachycardia) ([place])    a. 03/2017  Zio monitor: 7 episodes of PSVT - up to 6 beats, max rate 193.  . Valvular heart disease    a. 06/2017 Echo: EF 60-65%. No rwma. Mild MR. Mild to mod TR. PASP 73mmHg.   Past Surgical History:  Procedure Laterality Date  . EYE SURGERY Right    Done as a child  . VASECTOMY     30 yrs ago?   Social History   Socioeconomic History  . Marital status: Married    Spouse name: Not on file  . Number of children: 0  . Years of education: Not on file  . Highest  education level: Some college, no degree  Occupational History  . Occupation: Retired  Scientific laboratory technician  . Financial resource strain: Not hard at all  . Food insecurity:    Worry: Never true    Inability: Never true  . Transportation needs:    Medical: No    Non-medical: No  Tobacco Use  . Smoking status: Never Smoker  . Smokeless tobacco: Never Used  Substance and Sexual Activity  . Alcohol use: Yes    Alcohol/week: 2.4 oz    Types: 1 Glasses of wine, 1 Shots of liquor, 2 Standard drinks or equivalent per week    Comment: 1 GLASS WINE WEEKLY/ MARGARITA OCC  . Drug use: No  . Sexual activity: Not on file  Lifestyle  . Physical activity:    Days per week: Not on file    Minutes per session: Not on file  . Stress: Only a little  Relationships  . Social connections:    Talks on phone: Not on file    Gets together: Not on file    Attends religious service: Not on file    Active member of club or organization: Not on file    Attends meetings  of clubs or organizations: Not on file    Relationship status: Not on file  . Intimate partner violence:    Fear of current or ex partner: Not on file    Emotionally abused: Not on file    Physically abused: Not on file    Forced sexual activity: Not on file  Other Topics Concern  . Not on file  Social History Narrative  . Not on file   Social History   Social History Narrative  . Not on file     ROS: Negative.     PE: HEENT: Negative. Lungs: Clear. Cardio: RR.  Assessment/Plan:  Proceed with planned right inguinal hernia repair.  Forest Gleason [facility] 03/20/2018

## 2018-03-21 ENCOUNTER — Encounter: Payer: Self-pay | Admitting: General Surgery

## 2018-03-30 ENCOUNTER — Encounter: Payer: Self-pay | Admitting: General Surgery

## 2018-03-30 ENCOUNTER — Ambulatory Visit (INDEPENDENT_AMBULATORY_CARE_PROVIDER_SITE_OTHER): Payer: Medicare Other | Admitting: General Surgery

## 2018-03-30 VITALS — BP 122/70 | HR 85 | Resp 12 | Ht 72.0 in | Wt 161.0 lb

## 2018-03-30 DIAGNOSIS — K4090 Unilateral inguinal hernia, without obstruction or gangrene, not specified as recurrent: Secondary | ICD-10-CM

## 2018-03-30 NOTE — Patient Instructions (Addendum)
The patient is aware to call back for any questions or concerns.    Proper lifting techniques reviewed. Resume activities as tolerated. 

## 2018-03-30 NOTE — Progress Notes (Signed)
Patient ID: Andrew [name], male   DOB: [birth year], 72 y.o.   MRN: [id number]  Chief Complaint  Patient presents with  . Routine Post Op    HPI Andrew [name] is a 72 y.o. male here today for a post op right inguinal hernia repair done on 03/20/18. He reports that he is doing well. He reports no pain now. He has not needed to take any further pain med. He is using the bathroom with out difficulty. He is here today with his wife Andrew [name].  HPI  Past Medical History:  Diagnosis Date  . Atypical chest pain    a. Rest pain only; b. 08/2016 ETT: Ex time 75mins, no ST/T changes ->nl study.  . Headache    H/O MIGRAINES  . History of anxiety   . Hypertension   . NSVT (nonsustained ventricular tachycardia) ([place])    a. 03/2017 Zio Monitor: 2 episodes of NSVT - up to 7 beats, max HR 207.  Marland Kitchen Palpitations    a. 03/2017 14 day Zio Monitor: PACs, PVCs, brief PSVT and NSVT-->managed w/ beta blocker.  . Precancerous lesion   . PSVT (paroxysmal supraventricular tachycardia) ([place])    a. 03/2017  Zio monitor: 7 episodes of PSVT - up to 6 beats, max rate 193.  . Valvular heart disease    a. 06/2017 Echo: EF 60-65%. No rwma. Mild MR. Mild to mod TR. PASP 15mmHg.    Past Surgical History:  Procedure Laterality Date  . EYE SURGERY Right    Done as a child  . INGUINAL HERNIA REPAIR Right 03/20/2018   Large Ultrapro mesh.  Surgeon: Robert Bellow, MD;  Location: [facility] ORS;  Service: General;  Laterality: Right;  Marland Kitchen VASECTOMY     30 yrs ago?    Family History  Problem Relation Age of Onset  . Heart attack Father     Social History Social History   Tobacco Use  . Smoking status: Never Smoker  . Smokeless tobacco: Never Used  Substance Use Topics  . Alcohol use: Yes    Alcohol/week: 2.4 oz    Types: 1 Glasses of wine, 1 Shots of liquor, 2 Standard drinks or equivalent per week    Comment: 1 GLASS WINE WEEKLY/ MARGARITA OCC  . Drug use: No    No Known Allergies  Current Outpatient Medications   Medication Sig Dispense Refill  . aspirin EC 81 MG tablet Take 1 tablet (81 mg total) by mouth daily. 90 tablet 3  . cholecalciferol (VITAMIN D) 1000 UNITS tablet Take 1,000 Units by mouth daily.    Marland Kitchen HYDROcodone-acetaminophen (NORCO/VICODIN) 5-325 MG tablet Take 1 tablet by mouth every 4 (four) hours as needed for moderate pain. 15 tablet 0  . metoprolol succinate (TOPROL-XL) 25 MG 24 hr tablet Take 1 tablet (25 mg total) by mouth daily. (Patient taking differently: Take 25 mg by mouth every morning. ) 90 tablet 3  . Multiple Vitamin (MULTI-VITAMIN DAILY) TABS Take 1 tablet by mouth daily.     . saw palmetto 160 MG capsule Take 160 mg by mouth every other day.     . Turmeric 450 MG CAPS Take 450 mg by mouth daily.      No current facility-administered medications for this visit.     Review of Systems Review of Systems  Constitutional: Negative.   Respiratory: Negative.   Cardiovascular: Negative.     Blood pressure 122/70, pulse 85, resp. rate 12, height 6' (1.829 m), weight 161 lb (73 kg),  SpO2 98 %.  Physical Exam Physical Exam  Constitutional: He is oriented to person, place, and time. He appears well-developed and well-nourished.  Abdominal: Normal appearance.  Right inguinal hernia incision healing well and repair intact.  Genitourinary:     Neurological: He is alert and oriented to person, place, and time.  Skin: Skin is warm and dry.  Psychiatric: His behavior is normal.    Data Reviewed   Platelet count determinations dating back to 2013 shows him to be chronically low.  In 2013 135,000.  In 2018 in 2019 120,000.  White count and hemoglobin normal.  Myelodysplastic disease highly unlikely.  Assessment    Doing well post inguinal hernia repair.    Plan    Follow up as needed. Proper lifting techniques reviewed. Resume activities as tolerated.  The patient is aware to call back for any questions or concerns.     HPI, Physical Exam, Assessment and Plan have  been scribed under the direction and in the presence of Robert Bellow, MD  Concepcion Living, LPN  I have completed the exam and reviewed the above documentation for accuracy and completeness.  I agree with the above.  Haematologist has been used and any errors in dictation or transcription are unintentional.  Hervey Ard, M.D., F.A.C.S.   Forest Gleason Byrnett 03/30/2018, 9:18 AM

## 2018-04-11 ENCOUNTER — Ambulatory Visit (INDEPENDENT_AMBULATORY_CARE_PROVIDER_SITE_OTHER): Payer: Medicare Other | Admitting: Physician Assistant

## 2018-04-11 DIAGNOSIS — Z23 Encounter for immunization: Secondary | ICD-10-CM | POA: Diagnosis not present

## 2018-04-11 DIAGNOSIS — Z789 Other specified health status: Secondary | ICD-10-CM

## 2018-04-12 NOTE — Progress Notes (Signed)
Patient sat required ten minutes and tolerated vaccine well.

## 2018-04-17 NOTE — Addendum Note (Signed)
Addended by: Ashley Royalty E on: 04/17/2018 12:05 PM   Modules accepted: Orders

## 2018-05-16 DIAGNOSIS — L820 Inflamed seborrheic keratosis: Secondary | ICD-10-CM | POA: Diagnosis not present

## 2018-05-16 DIAGNOSIS — C44212 Basal cell carcinoma of skin of right ear and external auricular canal: Secondary | ICD-10-CM | POA: Diagnosis not present

## 2018-05-16 DIAGNOSIS — L578 Other skin changes due to chronic exposure to nonionizing radiation: Secondary | ICD-10-CM | POA: Diagnosis not present

## 2018-05-16 DIAGNOSIS — D485 Neoplasm of uncertain behavior of skin: Secondary | ICD-10-CM | POA: Diagnosis not present

## 2018-05-16 DIAGNOSIS — L570 Actinic keratosis: Secondary | ICD-10-CM | POA: Diagnosis not present

## 2018-06-06 DIAGNOSIS — L578 Other skin changes due to chronic exposure to nonionizing radiation: Secondary | ICD-10-CM | POA: Diagnosis not present

## 2018-06-06 DIAGNOSIS — L570 Actinic keratosis: Secondary | ICD-10-CM | POA: Diagnosis not present

## 2018-06-06 DIAGNOSIS — C44212 Basal cell carcinoma of skin of right ear and external auricular canal: Secondary | ICD-10-CM | POA: Diagnosis not present

## 2018-07-05 DIAGNOSIS — H2513 Age-related nuclear cataract, bilateral: Secondary | ICD-10-CM | POA: Diagnosis not present

## 2018-07-05 DIAGNOSIS — H01009 Unspecified blepharitis unspecified eye, unspecified eyelid: Secondary | ICD-10-CM | POA: Diagnosis not present

## 2018-07-18 DIAGNOSIS — L570 Actinic keratosis: Secondary | ICD-10-CM | POA: Diagnosis not present

## 2018-07-24 DIAGNOSIS — Z23 Encounter for immunization: Secondary | ICD-10-CM | POA: Diagnosis not present

## 2018-08-18 DIAGNOSIS — L570 Actinic keratosis: Secondary | ICD-10-CM | POA: Diagnosis not present

## 2018-10-17 ENCOUNTER — Ambulatory Visit: Payer: Self-pay | Admitting: Physician Assistant

## 2018-12-01 ENCOUNTER — Other Ambulatory Visit: Payer: Self-pay

## 2018-12-01 DIAGNOSIS — R0789 Other chest pain: Secondary | ICD-10-CM

## 2018-12-01 DIAGNOSIS — R002 Palpitations: Secondary | ICD-10-CM

## 2018-12-01 MED ORDER — METOPROLOL SUCCINATE ER 25 MG PO TB24: 25.0000 mg | ORAL_TABLET | Freq: Every day | ORAL | 3 refills | Status: DC

## 2018-12-05 DIAGNOSIS — D485 Neoplasm of uncertain behavior of skin: Secondary | ICD-10-CM | POA: Diagnosis not present

## 2018-12-05 DIAGNOSIS — L821 Other seborrheic keratosis: Secondary | ICD-10-CM | POA: Diagnosis not present

## 2018-12-05 DIAGNOSIS — D225 Melanocytic nevi of trunk: Secondary | ICD-10-CM | POA: Diagnosis not present

## 2018-12-05 DIAGNOSIS — D229 Melanocytic nevi, unspecified: Secondary | ICD-10-CM | POA: Diagnosis not present

## 2018-12-05 DIAGNOSIS — D224 Melanocytic nevi of scalp and neck: Secondary | ICD-10-CM | POA: Diagnosis not present

## 2018-12-05 DIAGNOSIS — L812 Freckles: Secondary | ICD-10-CM | POA: Diagnosis not present

## 2018-12-05 DIAGNOSIS — L578 Other skin changes due to chronic exposure to nonionizing radiation: Secondary | ICD-10-CM | POA: Diagnosis not present

## 2018-12-05 DIAGNOSIS — I788 Other diseases of capillaries: Secondary | ICD-10-CM | POA: Diagnosis not present

## 2018-12-05 DIAGNOSIS — Z1283 Encounter for screening for malignant neoplasm of skin: Secondary | ICD-10-CM | POA: Diagnosis not present

## 2018-12-05 DIAGNOSIS — Z85828 Personal history of other malignant neoplasm of skin: Secondary | ICD-10-CM | POA: Diagnosis not present

## 2018-12-05 DIAGNOSIS — L820 Inflamed seborrheic keratosis: Secondary | ICD-10-CM | POA: Diagnosis not present

## 2018-12-21 ENCOUNTER — Ambulatory Visit: Payer: Medicare Other | Admitting: Internal Medicine

## 2019-01-05 ENCOUNTER — Ambulatory Visit: Payer: Self-pay

## 2019-01-16 ENCOUNTER — Telehealth: Payer: Self-pay | Admitting: Internal Medicine

## 2019-01-16 NOTE — Telephone Encounter (Signed)
Virtual Visit Pre-Appointment Phone Call  Steps For Call:  1. Confirm consent - "In the setting of the current Covid19 crisis, you are scheduled for a (phone or video) visit with your provider on (date) at (time).  Just as we do with many in-office visits, in order for you to participate in this visit, we must obtain consent.  If you'd like, I can send this to your mychart (if signed up) or email for you to review.  Otherwise, I can obtain your verbal consent now.  All virtual visits are billed to your insurance company just like a normal visit would be.  By agreeing to a virtual visit, we'd like you to understand that the technology does not allow for your provider to perform an examination, and thus may limit your provider's ability to fully assess your condition.  Finally, though the technology is pretty good, we cannot assure that it will always work on either your or our end, and in the setting of a video visit, we may have to convert it to a phone-only visit.  In either situation, we cannot ensure that we have a secure connection.  Are you willing to proceed?"  2. Give patient instructions for WebEx download to smartphone as below if video visit  3. Advise patient to be prepared with any vital sign or heart rhythm information, their current medicines, and a piece of paper and pen handy for any instructions they may receive the day of their visit  4. Inform patient they will receive a phone call 15 minutes prior to their appointment time (may be from unknown caller ID) so they should be prepared to answer  5. Confirm that appointment type is correct in Epic appointment notes (video vs telephone)    TELEPHONE CALL NOTE  Andrew [name] has been deemed a candidate for a follow-up tele-health visit to limit community exposure during the Covid-19 pandemic. I spoke with the patient via phone to ensure availability of phone/video source, confirm preferred email & phone number, and discuss  instructions and expectations.  I reminded Andrew [name] to be prepared with any vital sign and/or heart rhythm information that could potentially be obtained via home monitoring, at the time of his visit. I reminded Andrew [name] to expect a phone call at the time of his visit if his visit.  Did the patient verbally acknowledge consent to treatment? YES  Clarisse Gouge 01/16/2019 9:56 AM   DOWNLOADING THE WEBEX SOFTWARE TO SMARTPHONE  - If Apple, go to [employer] and type in WebEx in the search bar. [place] [employer], the blue/green circle. The app is free but as with any other app downloads, their phone may require them to verify saved payment information or Apple password. The patient does NOT have to create an account.  - If Android, ask patient to go to [employer] and type in WebEx in the search bar. [place] [employer], the blue/green circle. The app is free but as with any other app downloads, their phone may require them to verify saved payment information or Android password. The patient does NOT have to create an account.   CONSENT FOR TELE-HEALTH VISIT - PLEASE REVIEW  I hereby voluntarily request, consent and authorize [facility] and its employed or contracted physicians, physician assistants, nurse practitioners or other licensed health care professionals (the Practitioner), to provide me with telemedicine health care services (the Services") as deemed necessary by the treating Practitioner. I  acknowledge and consent to receive the Services by the Practitioner via telemedicine. I understand that the telemedicine visit will involve communicating with the Practitioner through live audiovisual communication technology and the disclosure of certain medical information by electronic transmission. I acknowledge that I have been given the opportunity to request an in-person assessment or other available alternative prior to the telemedicine visit and am  voluntarily participating in the telemedicine visit.  I understand that I have the right to withhold or withdraw my consent to the use of telemedicine in the course of my care at any time, without affecting my right to future care or treatment, and that the Practitioner or I may terminate the telemedicine visit at any time. I understand that I have the right to inspect all information obtained and/or recorded in the course of the telemedicine visit and may receive copies of available information for a reasonable fee.  I understand that some of the potential risks of receiving the Services via telemedicine include:   Delay or interruption in medical evaluation due to technological equipment failure or disruption;  Information transmitted may not be sufficient (e.g. poor resolution of images) to allow for appropriate medical decision making by the Practitioner; and/or   In rare instances, security protocols could fail, causing a breach of personal health information.  Furthermore, I acknowledge that it is my responsibility to provide information about my medical history, conditions and care that is complete and accurate to the best of my ability. I acknowledge that Practitioner's advice, recommendations, and/or decision may be based on factors not within their control, such as incomplete or inaccurate data provided by me or distortions of diagnostic images or specimens that may result from electronic transmissions. I understand that the practice of medicine is not an exact science and that Practitioner makes no warranties or guarantees regarding treatment outcomes. I acknowledge that I will receive a copy of this consent concurrently upon execution via email to the email address I last provided but may also request a printed copy by calling the office of [facility].    I understand that my insurance will be billed for this visit.   I have read or had this consent read to me.  I understand the  contents of this consent, which adequately explains the benefits and risks of the Services being provided via telemedicine.   I have been provided ample opportunity to ask questions regarding this consent and the Services and have had my questions answered to my satisfaction.  I give my informed consent for the services to be provided through the use of telemedicine in my medical care  By participating in this telemedicine visit I agree to the above.

## 2019-01-17 NOTE — Progress Notes (Signed)
Virtual Visit via Video Note   This visit type was conducted due to national recommendations for restrictions regarding the COVID-19 Pandemic (e.g. social distancing) in an effort to limit this patient's exposure and mitigate transmission in our community.  Due to his co-morbid illnesses, this patient is at least at moderate risk for complications without adequate follow up.  This format is felt to be most appropriate for this patient at this time.  The patient did not have access to video technology/had technical difficulties with video requiring transitioning to audio format only (telephone).  All issues noted in this document were discussed and addressed.  No physical exam could be performed with this format.  Verbal consent was obtained from the patient.   Evaluation Performed:  Follow-up visit  Date:  01/18/2019   ID:  Andrew [name], DOB 21-Aug-1946, MRN [id number]  Patient Location: Home  Provider Location: Home  PCP:  Andrew Post, PA-C  Cardiologist:  Andrew Bush, MD  Electrophysiologist:  None   Chief Complaint:  Chest pain  History of Present Illness:    Andrew [name] is a 73 y.o. male who presents via audio/video conferencing for a telehealth visit today.  He has a history of atypical chest pain, palpitations with PSVT and NSVT, and anxiety.  He was last seen in our office in 11/2017 by Andrew Bayley, NP.  At that time, he noted occasional fleeting episodes of chest discomfort at rest.  No medication changes or additional testing was pursued at that time.  Today, Andrew [name] reports feeling about the same as at his last visit.  He still has episodic palpitations and brief chest discomfort.  This happens randomly, usually at rest.  He does not have exertional symptoms.  In fact, when he is active, he feels like the frequency of palpitations/chest pain decreases.  He denies shortness of breath, lightheadedness, and edema.  He is tolerating metoprolol well, though he is  not sure that it has been doing much in regard to his symptoms.  He is trying to walk some, but notes that overall his exercise has decreased since last year.  The patient does not have symptoms concerning for COVID-19 infection (fever, chills, cough, or new shortness of breath).    Past Medical History:  Diagnosis Date  . Atypical chest pain    a. Rest pain only; b. 08/2016 ETT: Ex time 70mins, no ST/T changes ->nl study.  . Headache    H/O MIGRAINES  . History of anxiety   . Hypertension   . NSVT (nonsustained ventricular tachycardia) ([place])    a. 03/2017 Zio Monitor: 2 episodes of NSVT - up to 7 beats, max HR 207.  Marland Kitchen Palpitations    a. 03/2017 14 day Zio Monitor: PACs, PVCs, brief PSVT and NSVT-->managed w/ beta blocker.  . Precancerous lesion   . PSVT (paroxysmal supraventricular tachycardia) ([place])    a. 03/2017  Zio monitor: 7 episodes of PSVT - up to 6 beats, max rate 193.  . Valvular heart disease    a. 06/2017 Echo: EF 60-65%. No rwma. Mild MR. Mild to mod TR. PASP 23mmHg.   Past Surgical History:  Procedure Laterality Date  . EYE SURGERY Right    Done as a child  . INGUINAL HERNIA REPAIR Right 03/20/2018   Large Ultrapro mesh.  Surgeon: Andrew Bellow, MD;  Location: [facility] ORS;  Service: General;  Laterality: Right;  Marland Kitchen VASECTOMY     30 yrs ago?     Current  Meds  Medication Sig  . aspirin EC 81 MG tablet Take 1 tablet (81 mg total) by mouth daily.  . cholecalciferol (VITAMIN D) 1000 UNITS tablet Take 1,000 Units by mouth daily.  . metoprolol succinate (TOPROL-XL) 25 MG 24 hr tablet Take 1 tablet (25 mg total) by mouth daily.  . Multiple Vitamin (MULTI-VITAMIN DAILY) TABS Take 1 tablet by mouth daily.   . saw palmetto 160 MG capsule Take 160 mg by mouth every other day.   . Turmeric 450 MG CAPS Take 450 mg by mouth daily.      Allergies:   Patient has no known allergies.   Social History   Tobacco Use  . Smoking status: Never Smoker  . Smokeless tobacco: Never Used   Substance Use Topics  . Alcohol use: Yes    Alcohol/week: 4.0 standard drinks    Types: 1 Glasses of wine, 1 Shots of liquor, 2 Standard drinks or equivalent per week    Comment: 1 GLASS WINE WEEKLY/ MARGARITA OCC  . Drug use: No     Family Hx: The patient's family history includes Heart attack in his father.  ROS:   Please see the history of present illness.   All other systems reviewed and are negative.   Prior CV studies:   The following studies were reviewed today:  TTE (06/17/2017): Normal LV size with mild focal basal hypertrophy of the septum.  LVEF 60-65% with normal wall motion and diastolic function.  Mild MR.  Normal RV size and function.  Mild to moderate tricuspid regurgitation.  14-day event monitor (02/24/2017): Predominantly sinus rhythm with rare PACs and occasional PVCs.  Brief runs of PSVT and NSVT noted.  Exercise tolerance test (08/13/2016): Normal study with fair exercise tolerance.  No ischemic EKG changes.  Labs/Other Tests and Data Reviewed:    EKG:  No ECG reviewed.  Recent Labs: 03/13/2018: BUN 16; Creatinine, Ser 0.87; Hemoglobin 13.7; Platelets 120; Potassium 4.2; Sodium 139   Recent Lipid Panel Lab Results  Component Value Date/Time   CHOL 183 01/10/2018 11:13 AM   TRIG 77 01/10/2018 11:13 AM   HDL 48 01/10/2018 11:13 AM   CHOLHDL 3.8 01/10/2018 11:13 AM   CHOLHDL 3.9 02/24/2017 08:18 AM   LDLCALC 120 (H) 01/10/2018 11:13 AM    Wt Readings from Last 3 Encounters:  01/18/19 159 lb (72.1 kg)  03/30/18 161 lb (73 kg)  03/20/18 160 lb 8 oz (72.8 kg)     Objective:    Vital Signs:  BP 115/69 (BP Location: Left Arm, Patient Position: Sitting, Cuff Size: Normal)   Pulse 70   Ht 6' (1.829 m)   Wt 159 lb (72.1 kg)   BMI 21.56 kg/m    Well nourished, well developed male in no acute distress.  ASSESSMENT & PLAN:    NSVT and PSVT: Andrew [name] reports stable symptoms, though he is not sure if metoprolol is actually doing much for him.  When  he exercises regularly, the symptoms seem to be less frequent.  We have agreed to continue current dose of metoprolol and work on increasing his exercise.  No further testing or intervention at this time.  Atypical chest pain: Long-standing an non-exertional.  Exercise tolerance test in 2017 was low risk, albeit with decreased exercise tolerance.  Given stable symptoms, we will defer additional testing and focus on increasing activity and primary prevention.  COVID-19 Education: The signs and symptoms of COVID-19 were discussed with the patient and how to seek care for  testing (follow up with PCP or arrange E-visit).  The importance of social distancing was discussed today.  Time:   Today, I have spent 12 minutes with the patient with telehealth technology discussing the above problems.     Medication Adjustments/Labs and Tests Ordered: Current medicines are reviewed at length with the patient today.  Concerns regarding medicines are outlined above.   Tests Ordered: None. Defer routine labs to annual wellness check with his PCP.  Medication Changes: None.  Disposition:  Follow up in 1 year(s)  Signed, Andrew Bush, MD  01/18/2019 9:29 AM    [facility]

## 2019-01-18 ENCOUNTER — Telehealth (INDEPENDENT_AMBULATORY_CARE_PROVIDER_SITE_OTHER): Payer: Medicare Other | Admitting: Internal Medicine

## 2019-01-18 ENCOUNTER — Encounter: Payer: Self-pay | Admitting: Internal Medicine

## 2019-01-18 ENCOUNTER — Telehealth: Payer: Medicare Other | Admitting: Internal Medicine

## 2019-01-18 ENCOUNTER — Other Ambulatory Visit: Payer: Self-pay

## 2019-01-18 VITALS — BP 115/69 | HR 70 | Ht 72.0 in | Wt 159.0 lb

## 2019-01-18 DIAGNOSIS — I4729 Other ventricular tachycardia: Secondary | ICD-10-CM

## 2019-01-18 DIAGNOSIS — I472 Ventricular tachycardia: Secondary | ICD-10-CM

## 2019-01-18 DIAGNOSIS — I471 Supraventricular tachycardia: Secondary | ICD-10-CM | POA: Insufficient documentation

## 2019-01-18 DIAGNOSIS — R0789 Other chest pain: Secondary | ICD-10-CM

## 2019-01-18 NOTE — Patient Instructions (Signed)
Medication Instructions:  Your physician recommends that you continue on your current medications as directed. Please refer to the Current Medication list given to you today.  If you need a refill on your cardiac medications before your next appointment, please call your pharmacy.   Lab work: none If you have labs (blood work) drawn today and your tests are completely normal, you will receive your results only by: . MyChart Message (if you have MyChart) OR . A paper copy in the mail If you have any lab test that is abnormal or we need to change your treatment, we will call you to review the results.  Testing/Procedures: none  Follow-Up: At [facility], you and your health needs are our priority.  As part of our continuing mission to provide you with exceptional heart care, we have created designated Provider Care Teams.  These Care Teams include your primary Cardiologist (physician) and Advanced Practice Providers (APPs -  Physician Assistants and Nurse Practitioners) who all work together to provide you with the care you need, when you need it. You will need a follow up appointment in 12 months.  Please call our office 2 months in advance to schedule this appointment.  You may see Christopher End, MD or one of the following Advanced Practice Providers on your designated Care Team:   Christopher Berge, NP Ryan Dunn, PA-C . Jacquelyn Visser, PA-C    

## 2019-05-10 ENCOUNTER — Telehealth: Payer: Self-pay

## 2019-05-10 NOTE — Telephone Encounter (Signed)
LMTCB and schedule telephonic AWV.

## 2019-05-14 ENCOUNTER — Other Ambulatory Visit: Payer: Self-pay

## 2019-05-15 NOTE — Telephone Encounter (Signed)
Spoke with wife and scheduled a telephonic AWV at 05/23/19 @ 9:20 AM.

## 2019-05-15 NOTE — Telephone Encounter (Signed)
Pt LM requesting a CB @ home # when available.

## 2019-05-23 ENCOUNTER — Ambulatory Visit (INDEPENDENT_AMBULATORY_CARE_PROVIDER_SITE_OTHER): Payer: Medicare Other

## 2019-05-23 ENCOUNTER — Telehealth: Payer: Self-pay | Admitting: Physician Assistant

## 2019-05-23 ENCOUNTER — Other Ambulatory Visit: Payer: Self-pay

## 2019-05-23 DIAGNOSIS — Z0000 Encounter for general adult medical examination without abnormal findings: Secondary | ICD-10-CM | POA: Diagnosis not present

## 2019-05-23 NOTE — Progress Notes (Signed)
Subjective:   Andrew [name] is a 73 y.o. male who presents for Medicare Annual/Subsequent preventive examination.    This visit is being conducted through telemedicine due to the COVID-19 pandemic. This patient has given me verbal consent via doximity to conduct this visit, patient states they are participating from their home address. Some vital signs may be absent or patient reported.    Patient identification: identified by name, DOB, and current address  Review of Systems:  N/A  Cardiac Risk Factors include: advanced age (>28men, >27 women);hypertension;male gender     Objective:    Vitals: There were no vitals taken for this visit.  There is no height or weight on file to calculate BMI. Unable to obtain vitals due to visit being conducted via telephonically.   Advanced Directives 05/23/2019 02/21/2018 01/03/2018 12/14/2015  Does Patient Have a Medical Advance Directive? Yes No Yes Yes  Type of Paramedic of [place];Living will - [place];Living will Living will  Copy of [place] in Chart? Yes - validated most recent copy scanned in chart (See row information) - Yes -    Tobacco Social History   Tobacco Use  Smoking Status Never Smoker  Smokeless Tobacco Never Used     Counseling given: Not Answered   Clinical Intake:  Pre-visit preparation completed: Yes  Pain : No/denies pain Pain Score: 0-No pain     Diabetes: No  How often do you need to have someone help you when you read instructions, pamphlets, or other written materials from your doctor or pharmacy?: 1 - Never  Interpreter Needed?: No  Information entered by :: [facility], LPN  Past Medical History:  Diagnosis Date  . Atypical chest pain    a. Rest pain only; b. 08/2016 ETT: Ex time 73mins, no ST/T changes ->nl study.  . Headache    H/O MIGRAINES  . History of anxiety   . Hypertension   . NSVT (nonsustained ventricular tachycardia)  ([place])    a. 03/2017 Zio Monitor: 2 episodes of NSVT - up to 7 beats, max HR 207.  Marland Kitchen Palpitations    a. 03/2017 14 day Zio Monitor: PACs, PVCs, brief PSVT and NSVT-->managed w/ beta blocker.  . Precancerous lesion   . PSVT (paroxysmal supraventricular tachycardia) ([place])    a. 03/2017  Zio monitor: 7 episodes of PSVT - up to 6 beats, max rate 193.  . Valvular heart disease    a. 06/2017 Echo: EF 60-65%. No rwma. Mild MR. Mild to mod TR. PASP 71mmHg.   Past Surgical History:  Procedure Laterality Date  . EYE SURGERY Right    Done as a child  . INGUINAL HERNIA REPAIR Right 03/20/2018   Large Ultrapro mesh.  Surgeon: Robert Bellow, MD;  Location: [facility] ORS;  Service: General;  Laterality: Right;  Marland Kitchen VASECTOMY     30 yrs ago?   Family History  Problem Relation Age of Onset  . Heart attack Father    Social History   Socioeconomic History  . Marital status: Married    Spouse name: Not on file  . Number of children: 0  . Years of education: Not on file  . Highest education level: Some college, no degree  Occupational History  . Occupation: Retired  Scientific laboratory technician  . Financial resource strain: Not hard at all  . Food insecurity    Worry: Never true    Inability: Never true  . Transportation needs    Medical:  No    Non-medical: No  Tobacco Use  . Smoking status: Never Smoker  . Smokeless tobacco: Never Used  Substance and Sexual Activity  . Alcohol use: Yes    Alcohol/week: 4.0 standard drinks    Types: 1 Glasses of wine, 1 Shots of liquor, 2 Standard drinks or equivalent per week    Comment: 1 GLASS WINE WEEKLY/ MARGARITA OCC  . Drug use: No  . Sexual activity: Not on file  Lifestyle  . Physical activity    Days per week: 0 days    Minutes per session: 0 min  . Stress: Not at all  Relationships  . Social Herbalist on phone: Patient refused    Gets together: Patient refused    Attends religious service: Patient refused    Active member of club or organization:  Patient refused    Attends meetings of clubs or organizations: Patient refused    Relationship status: Patient refused  Other Topics Concern  . Not on file  Social History Narrative  . Not on file    Outpatient Encounter Medications as of 05/23/2019  Medication Sig  . aspirin EC 81 MG tablet Take 1 tablet (81 mg total) by mouth daily.  . cholecalciferol (VITAMIN D) 1000 UNITS tablet Take 1,000 Units by mouth daily.  . metoprolol succinate (TOPROL-XL) 25 MG 24 hr tablet Take 1 tablet (25 mg total) by mouth daily.  . Multiple Vitamin (MULTI-VITAMIN DAILY) TABS Take 1 tablet by mouth daily.   . saw palmetto 160 MG capsule Take 160 mg by mouth every other day.   Marland Kitchen SHINGRIX injection   . Turmeric 450 MG CAPS Take 450 mg by mouth daily.    No facility-administered encounter medications on file as of 05/23/2019.     Activities of Daily Living In your present state of health, do you have any difficulty performing the following activities: 05/23/2019  Hearing? N  Vision? N  Comment Wears eye glasses.  Difficulty concentrating or making decisions? N  Walking or climbing stairs? N  Dressing or bathing? N  Doing errands, shopping? N  Preparing Food and eating ? N  Using the Toilet? N  In the past six months, have you accidently leaked urine? N  Do you have problems with loss of bowel control? N  Managing your Medications? N  Managing your Finances? N  Housekeeping or managing your Housekeeping? N  Some recent data might be hidden    Patient Care Team: Paulene Floor as PCP - General (Physician Assistant) End, Harrell Gave, MD as PCP - Cardiology (Cardiology) Theora Gianotti, NP as Nurse Practitioner (Nurse Practitioner) Brendolyn Patty, MD as Consulting Physician (Dermatology)   Assessment:   This is a routine wellness examination for Kenric.  Exercise Activities and Dietary recommendations Current Exercise Habits: Home exercise routine, Type of exercise: walking,  Time (Minutes): 30, Frequency (Times/Week): 1, Weekly Exercise (Minutes/Week): 30, Intensity: Mild, Exercise limited by: None identified  Goals    . Exercise 3x per week (30 min per time)     Recommend increasing exercise to 3 days a week for at least 30 minutes.        Fall Risk: Fall Risk  05/23/2019 01/03/2018  Falls in the past year? 0 No    FALL RISK PREVENTION PERTAINING TO THE HOME:  Any stairs in or around the home? Yes  If so, are there any without handrails? No   Home free of loose throw rugs in walkways, pet  beds, electrical cords, etc? Yes  Adequate lighting in your home to reduce risk of falls? Yes   ASSISTIVE DEVICES UTILIZED TO PREVENT FALLS:  Life alert? No  Use of a cane, walker or w/c? No  Grab bars in the bathroom? No  Shower chair or bench in shower? No  Elevated toilet seat or a handicapped toilet? Yes   TIMED UP AND GO:  Was the test performed? No .    Depression Screen PHQ 2/9 Scores 05/23/2019 01/03/2018  PHQ - 2 Score 0 0     Cognitive Function: Declined today.      6CIT Screen 01/03/2018  What Year? 0 points  What month? 0 points  What time? 0 points  Count back from 20 0 points  Months in reverse 2 points  Repeat phrase 0 points  Total Score 2    Immunization History  Administered Date(s) Administered  . Hepatitis A, Adult 08/30/2017, 04/11/2018  . Influenza, High Dose Seasonal PF 08/09/2017, 07/24/2018  . Pneumococcal Conjugate-13 01/10/2018  . Tdap 08/09/2017  . Typhoid Inactivated 08/30/2017  . Zoster Recombinat (Shingrix) 02/07/2019    Qualifies for Shingles Vaccine? Yes  , awaiting the second Shingrix dose.   Tdap: Up to date  Flu Vaccine: Due fall 2020  Pneumococcal Vaccine: Due for Pneumococcal vaccine. Does the patient want to receive this vaccine today?  No .   Screening Tests Health Maintenance  Topic Date Due  . PNA vac Low Risk Adult (2 of 2 - PPSV23) 01/11/2019  . INFLUENZA VACCINE  05/12/2019  .  COLONOSCOPY  05/28/2020  . TETANUS/TDAP  08/10/2027  . Hepatitis C Screening  Completed   Cancer Screenings:  Colorectal Screening: Completed 05/28/17. Repeat every 3 years.  Lung Cancer Screening: (Low Dose CT Chest recommended if Age 37-80 years, 30 pack-year currently smoking OR have quit w/in 15years.) does not qualify.   Additional Screening:  Hepatitis C Screening: Up to date  Vision Screening: Recommended annual ophthalmology exams for early detection of glaucoma and other disorders of the eye.  Dental Screening: Recommended annual dental exams for proper oral hygiene  Community Resource Referral:  CRR required this visit?  No        Plan:  I have personally reviewed and addressed the Medicare Annual Wellness questionnaire and have noted the following in the patient's chart:  A. Medical and social history B. Use of alcohol, tobacco or illicit drugs  C. Current medications and supplements D. Functional ability and status E.  Nutritional status F.  Physical activity G. Advance directives H. List of other physicians I.  Hospitalizations, surgeries, and ER visits in previous 12 months J.  [place] such as hearing and vision if needed, cognitive and depression L. Referrals and appointments   In addition, I have reviewed and discussed with patient certain preventive protocols, quality metrics, and best practice recommendations. A written personalized care plan for preventive services as well as general preventive health recommendations were provided to patient.   [place] Score, [place]  [id number] Nurse Health Advisor   Nurse Notes: Pt to receive a Pneumovax 23 vaccine at next in office visit.

## 2019-05-23 NOTE — Telephone Encounter (Signed)
Pt's wife called wanting to know if her husband has had his 2nd pneumonia vaccine.  He did not get it here but pt's wife said you could look on the state registry. She said they do not know if he got it at a [facility] or not.  CB#  [phone], she said you can leave a message  [place]

## 2019-05-23 NOTE — Patient Instructions (Signed)
Andrew [name] , Thank you for taking time to come for your Medicare Wellness Visit. I appreciate your ongoing commitment to your health goals. Please review the following plan we discussed and let me know if I can assist you in the future.   Screening recommendations/referrals: Colonoscopy: Up to date, due 05/2020 Recommended yearly ophthalmology/optometry visit for glaucoma screening and checkup Recommended yearly dental visit for hygiene and checkup  Vaccinations: Influenza vaccine: Due fall 2020 Pneumococcal vaccine: Pneumovax 23 due.  Tdap vaccine: Up to date, due 07/2027 Shingles vaccine: Awaiting second Shingrix vaccine.    Advanced directives: Currently on file.  Conditions/risks identified: Continue to work towards exercising 3 days a week for at least 30 minutes at a time.   Next appointment: 06/13/19 @ 10:20 AM with Carles Collet.   Preventive Care 23 Years and Older, Male Preventive care refers to lifestyle choices and visits with your health care provider that can promote health and wellness. What does preventive care include?  A yearly physical exam. This is also called an annual well check.  Dental exams once or twice a year.  Routine eye exams. Ask your health care provider how often you should have your eyes checked.  Personal lifestyle choices, including:  Daily care of your teeth and gums.  Regular physical activity.  Eating a healthy diet.  Avoiding tobacco and drug use.  Limiting alcohol use.  Practicing safe sex.  Taking low doses of aspirin every day.  Taking vitamin and mineral supplements as recommended by your health care provider. What happens during an annual well check? The services and screenings done by your health care provider during your annual well check will depend on your age, overall health, lifestyle risk factors, and family history of disease. Counseling  Your health care provider may ask you questions about your:  Alcohol use.   Tobacco use.  Drug use.  Emotional well-being.  Home and relationship well-being.  Sexual activity.  Eating habits.  History of falls.  Memory and ability to understand (cognition).  Work and work Statistician. Screening  You may have the following tests or measurements:  Height, weight, and BMI.  Blood pressure.  Lipid and cholesterol levels. These may be checked every 5 years, or more frequently if you are over 42 years old.  Skin check.  Lung cancer screening. You may have this screening every year starting at age 69 if you have a 30-pack-year history of smoking and currently smoke or have quit within the past 15 years.  Fecal occult blood test (FOBT) of the stool. You may have this test every year starting at age 41.  Flexible sigmoidoscopy or colonoscopy. You may have a sigmoidoscopy every 5 years or a colonoscopy every 10 years starting at age 51.  Prostate cancer screening. Recommendations will vary depending on your family history and other risks.  Hepatitis C blood test.  Hepatitis B blood test.  Sexually transmitted disease (STD) testing.  Diabetes screening. This is done by checking your blood sugar (glucose) after you have not eaten for a while (fasting). You may have this done every 1-3 years.  Abdominal aortic aneurysm (AAA) screening. You may need this if you are a current or former smoker.  Osteoporosis. You may be screened starting at age 76 if you are at high risk. Talk with your health care provider about your test results, treatment options, and if necessary, the need for more tests. Vaccines  Your health care provider may recommend certain vaccines, such as:  Influenza  vaccine. This is recommended every year.  Tetanus, diphtheria, and acellular pertussis (Tdap, Td) vaccine. You may need a Td booster every 10 years.  Zoster vaccine. You may need this after age 32.  Pneumococcal 13-valent conjugate (PCV13) vaccine. One dose is recommended  after age 67.  Pneumococcal polysaccharide (PPSV23) vaccine. One dose is recommended after age 31. Talk to your health care provider about which screenings and vaccines you need and how often you need them. This information is not intended to replace advice given to you by your health care provider. Make sure you discuss any questions you have with your health care provider. Document Released: 10/24/2015 Document Revised: 06/16/2016 Document Reviewed: 07/29/2015 Elsevier Interactive Patient Education  2017 [place] Prevention in the Home Falls can cause injuries. They can happen to people of all ages. There are many things you can do to make your home safe and to help prevent falls. What can I do on the outside of my home?  Regularly fix the edges of walkways and driveways and fix any cracks.  Remove anything that might make you trip as you walk through a door, such as a raised step or threshold.  Trim any bushes or trees on the path to your home.  Use bright outdoor lighting.  Clear any walking paths of anything that might make someone trip, such as rocks or tools.  Regularly check to see if handrails are loose or broken. Make sure that both sides of any steps have handrails.  Any raised decks and porches should have guardrails on the edges.  Have any leaves, snow, or ice cleared regularly.  Use sand or salt on walking paths during winter.  Clean up any spills in your garage right away. This includes oil or grease spills. What can I do in the bathroom?  Use night lights.  Install grab bars by the toilet and in the tub and shower. Do not use towel bars as grab bars.  Use non-skid mats or decals in the tub or shower.  If you need to sit down in the shower, use a plastic, non-slip stool.  Keep the floor dry. Clean up any water that spills on the floor as soon as it happens.  Remove soap buildup in the tub or shower regularly.  Attach bath mats securely with  double-sided non-slip rug tape.  Do not have throw rugs and other things on the floor that can make you trip. What can I do in the bedroom?  Use night lights.  Make sure that you have a light by your bed that is easy to reach.  Do not use any sheets or blankets that are too big for your bed. They should not hang down onto the floor.  Have a firm chair that has side arms. You can use this for support while you get dressed.  Do not have throw rugs and other things on the floor that can make you trip. What can I do in the kitchen?  Clean up any spills right away.  Avoid walking on wet floors.  Keep items that you use a lot in easy-to-reach places.  If you need to reach something above you, use a strong step stool that has a grab bar.  Keep electrical cords out of the way.  Do not use floor polish or wax that makes floors slippery. If you must use wax, use non-skid floor wax.  Do not have throw rugs and other things on the floor that can  make you trip. What can I do with my stairs?  Do not leave any items on the stairs.  Make sure that there are handrails on both sides of the stairs and use them. Fix handrails that are broken or loose. Make sure that handrails are as long as the stairways.  Check any carpeting to make sure that it is firmly attached to the stairs. Fix any carpet that is loose or worn.  Avoid having throw rugs at the top or bottom of the stairs. If you do have throw rugs, attach them to the floor with carpet tape.  Make sure that you have a light switch at the top of the stairs and the bottom of the stairs. If you do not have them, ask someone to add them for you. What else can I do to help prevent falls?  Wear shoes that:  Do not have high heels.  Have rubber bottoms.  Are comfortable and fit you well.  Are closed at the toe. Do not wear sandals.  If you use a stepladder:  Make sure that it is fully opened. Do not climb a closed stepladder.  Make  sure that both sides of the stepladder are locked into place.  Ask someone to hold it for you, if possible.  Clearly mark and make sure that you can see:  Any grab bars or handrails.  First and last steps.  Where the edge of each step is.  Use tools that help you move around (mobility aids) if they are needed. These include:  Canes.  Walkers.  Scooters.  Crutches.  Turn on the lights when you go into a dark area. Replace any light bulbs as soon as they burn out.  Set up your furniture so you have a clear path. Avoid moving your furniture around.  If any of your floors are uneven, fix them.  If there are any pets around you, be aware of where they are.  Review your medicines with your doctor. Some medicines can make you feel dizzy. This can increase your chance of falling. Ask your doctor what other things that you can do to help prevent falls. This information is not intended to replace advice given to you by your health care provider. Make sure you discuss any questions you have with your health care provider. Document Released: 07/24/2009 Document Revised: 03/04/2016 Document Reviewed: 11/01/2014 Elsevier Interactive Patient Education  2017 [employer].

## 2019-05-24 NOTE — Telephone Encounter (Signed)
Unable to reach wife at number listed below it states not in service. Attempted to call wife on her cell phone and left a detailed message letting her know that patient would be due for Pneumovax since he has had prevnar 13. Patient can receive vaccine at his next scheduled appointment on 06/13/19. KW

## 2019-06-05 DIAGNOSIS — Z85828 Personal history of other malignant neoplasm of skin: Secondary | ICD-10-CM | POA: Diagnosis not present

## 2019-06-05 DIAGNOSIS — Z1283 Encounter for screening for malignant neoplasm of skin: Secondary | ICD-10-CM | POA: Diagnosis not present

## 2019-06-05 DIAGNOSIS — L578 Other skin changes due to chronic exposure to nonionizing radiation: Secondary | ICD-10-CM | POA: Diagnosis not present

## 2019-06-05 DIAGNOSIS — D1800 Hemangioma unspecified site: Secondary | ICD-10-CM | POA: Diagnosis not present

## 2019-06-05 DIAGNOSIS — D225 Melanocytic nevi of trunk: Secondary | ICD-10-CM | POA: Diagnosis not present

## 2019-06-05 DIAGNOSIS — L821 Other seborrheic keratosis: Secondary | ICD-10-CM | POA: Diagnosis not present

## 2019-06-05 DIAGNOSIS — L820 Inflamed seborrheic keratosis: Secondary | ICD-10-CM | POA: Diagnosis not present

## 2019-06-05 DIAGNOSIS — L570 Actinic keratosis: Secondary | ICD-10-CM | POA: Diagnosis not present

## 2019-06-13 ENCOUNTER — Encounter: Payer: Self-pay | Admitting: Physician Assistant

## 2019-06-13 ENCOUNTER — Other Ambulatory Visit: Payer: Self-pay

## 2019-06-13 ENCOUNTER — Ambulatory Visit (INDEPENDENT_AMBULATORY_CARE_PROVIDER_SITE_OTHER): Payer: Medicare Other | Admitting: Physician Assistant

## 2019-06-13 VITALS — BP 135/83 | HR 90 | Temp 96.9°F | Wt 164.0 lb

## 2019-06-13 DIAGNOSIS — D691 Qualitative platelet defects: Secondary | ICD-10-CM

## 2019-06-13 DIAGNOSIS — R251 Tremor, unspecified: Secondary | ICD-10-CM | POA: Diagnosis not present

## 2019-06-13 DIAGNOSIS — Z0000 Encounter for general adult medical examination without abnormal findings: Secondary | ICD-10-CM | POA: Diagnosis not present

## 2019-06-13 DIAGNOSIS — R7989 Other specified abnormal findings of blood chemistry: Secondary | ICD-10-CM | POA: Diagnosis not present

## 2019-06-13 DIAGNOSIS — R7309 Other abnormal glucose: Secondary | ICD-10-CM | POA: Diagnosis not present

## 2019-06-13 DIAGNOSIS — E785 Hyperlipidemia, unspecified: Secondary | ICD-10-CM

## 2019-06-13 DIAGNOSIS — R002 Palpitations: Secondary | ICD-10-CM

## 2019-06-13 NOTE — Progress Notes (Addendum)
Patient: Andrew [name], Male    DOB: 07-Jul-1946, 73 y.o.   MRN: [id number] Visit Date: 06/13/2019  Today's Provider: Trinna Post, PA-C    Subjective:     Complete Physical Andrew [name] is a 73 y.o. male. He feels well. He reports not exercising. He reports he is sleeping well.  Had inguinal hernia surgery repair 03/2018. He is recovering nicely from this and is pleased with the progress.  Continues to be followed by cardiology for palpitations and is on metoprolol XL 25 mg daily.   He continues to have issues with tremor, perhaps slightly worse this year. Does not desire treatment for it.   He got his second shingles vaccine on 06/06/2019 at the pharmacy.  -----------------------------------------------------------   Review of Systems  Constitutional: Negative.   HENT: Negative.   Eyes: Negative.   Respiratory: Negative.   Cardiovascular: Negative.   Gastrointestinal: Negative.   Endocrine: Negative.   Genitourinary: Negative.   Musculoskeletal: Negative.   Skin: Negative.   Allergic/Immunologic: Negative.   Neurological: Negative.   Hematological: Negative.   Psychiatric/Behavioral: Negative.     Social History   Socioeconomic History  . Marital status: Married    Spouse name: Not on file  . Number of children: 0  . Years of education: Not on file  . Highest education level: Some college, no degree  Occupational History  . Occupation: Retired  Scientific laboratory technician  . Financial resource strain: Not hard at all  . Food insecurity    Worry: Never true    Inability: Never true  . Transportation needs    Medical: No    Non-medical: No  Tobacco Use  . Smoking status: Never Smoker  . Smokeless tobacco: Never Used  Substance and Sexual Activity  . Alcohol use: Yes    Alcohol/week: 4.0 standard drinks    Types: 1 Glasses of wine, 1 Shots of liquor, 2 Standard drinks or equivalent per week    Comment: 1 GLASS WINE WEEKLY/ MARGARITA OCC  . Drug use: No    . Sexual activity: Not on file  Lifestyle  . Physical activity    Days per week: 0 days    Minutes per session: 0 min  . Stress: Not at all  Relationships  . Social Herbalist on phone: Patient refused    Gets together: Patient refused    Attends religious service: Patient refused    Active member of club or organization: Patient refused    Attends meetings of clubs or organizations: Patient refused    Relationship status: Patient refused  . Intimate partner violence    Fear of current or ex partner: Patient refused    Emotionally abused: Patient refused    Physically abused: Patient refused    Forced sexual activity: Patient refused  Other Topics Concern  . Not on file  Social History Narrative  . Not on file    Past Medical History:  Diagnosis Date  . Atypical chest pain    a. Rest pain only; b. 08/2016 ETT: Ex time 6mins, no ST/T changes ->nl study.  . Headache    H/O MIGRAINES  . History of anxiety   . Hypertension   . NSVT (nonsustained ventricular tachycardia) ([place])    a. 03/2017 Zio Monitor: 2 episodes of NSVT - up to 7 beats, max HR 207.  Marland Kitchen Palpitations    a. 03/2017 14 day Zio Monitor: PACs, PVCs, brief PSVT and NSVT-->managed w/  beta blocker.  . Precancerous lesion   . PSVT (paroxysmal supraventricular tachycardia) ([place])    a. 03/2017  Zio monitor: 7 episodes of PSVT - up to 6 beats, max rate 193.  . Valvular heart disease    a. 06/2017 Echo: EF 60-65%. No rwma. Mild MR. Mild to mod TR. PASP 3mmHg.     Patient Active Problem List   Diagnosis Date Noted  . PSVT (paroxysmal supraventricular tachycardia) ([place]) 01/18/2019  . Inguinal hernia of right side without obstruction or gangrene 02/19/2018  . NSVT (nonsustained ventricular tachycardia) ([place]) 06/07/2017  . Atypical chest pain 06/07/2017  . Chest discomfort 07/17/2013  . Palpitations 11/12/2011    Past Surgical History:  Procedure Laterality Date  . EYE SURGERY Right    Done as a child  .  INGUINAL HERNIA REPAIR Right 03/20/2018   Large Ultrapro mesh.  Surgeon: Robert Bellow, MD;  Location: [facility] ORS;  Service: General;  Laterality: Right;  Marland Kitchen VASECTOMY     30 yrs ago?    His family history includes Heart attack in his father.   Current Outpatient Medications:  .  aspirin EC 81 MG tablet, Take 1 tablet (81 mg total) by mouth daily., Disp: 90 tablet, Rfl: 3 .  cholecalciferol (VITAMIN D) 1000 UNITS tablet, Take 1,000 Units by mouth daily., Disp: , Rfl:  .  metoprolol succinate (TOPROL-XL) 25 MG 24 hr tablet, Take 1 tablet (25 mg total) by mouth daily., Disp: 90 tablet, Rfl: 3 .  Multiple Vitamin (MULTI-VITAMIN DAILY) TABS, Take 1 tablet by mouth daily. , Disp: , Rfl:  .  saw palmetto 160 MG capsule, Take 160 mg by mouth every other day. , Disp: , Rfl:  .  Turmeric 450 MG CAPS, Take 450 mg by mouth daily. , Disp: , Rfl:  .  SHINGRIX injection, , Disp: , Rfl:   Patient Care Team: Paulene Floor as PCP - General (Physician Assistant) End, Harrell Gave, MD as PCP - Cardiology (Cardiology) Theora Gianotti, NP as Nurse Practitioner (Nurse Practitioner) Brendolyn Patty, MD as Consulting Physician (Dermatology)     Objective:    Vitals: BP 135/83 (BP Location: Right Arm, Patient Position: Sitting, Cuff Size: Normal)   Pulse 90   Temp (!) 96.9 F (36.1 C) (Temporal)   Wt 164 lb (74.4 kg)   BMI 22.24 kg/m   Physical Exam Constitutional:      Appearance: Normal appearance.  HENT:     Right Ear: Tympanic membrane and ear canal normal.     Left Ear: Tympanic membrane and ear canal normal.  Cardiovascular:     Rate and Rhythm: Normal rate and regular rhythm.     Heart sounds: Normal heart sounds.  Pulmonary:     Effort: Pulmonary effort is normal.     Breath sounds: Normal breath sounds.  Skin:    General: Skin is warm and dry.  Neurological:     Mental Status: He is alert and oriented to person, place, and time. Mental status is at baseline.    Psychiatric:        Mood and Affect: Mood normal.        Behavior: Behavior normal.     Activities of Daily Living In your present state of health, do you have any difficulty performing the following activities: 05/23/2019  Hearing? N  Vision? N  Comment Wears eye glasses.  Difficulty concentrating or making decisions? N  Walking or climbing stairs? N  Dressing or bathing? N  Doing  errands, shopping? N  Preparing Food and eating ? N  Using the Toilet? N  In the past six months, have you accidently leaked urine? N  Do you have problems with loss of bowel control? N  Managing your Medications? N  Managing your Finances? N  Housekeeping or managing your Housekeeping? N  Some recent data might be hidden    Fall Risk Assessment Fall Risk  05/23/2019 01/03/2018  Falls in the past year? 0 No     Depression Screen PHQ 2/9 Scores 05/23/2019 01/03/2018  PHQ - 2 Score 0 0    6CIT Screen 01/03/2018  What Year? 0 points  What month? 0 points  What time? 0 points  Count back from 20 0 points  Months in reverse 2 points  Repeat phrase 0 points  Total Score 2       Assessment & Plan:    Annual Physical Reviewed patient's Family Medical History Reviewed and updated list of patient's medical providers Assessment of cognitive impairment was done Assessed patient's functional ability Established a written schedule for health screening [place] Completed and Reviewed  Exercise Activities and Dietary recommendations Goals    . Exercise 3x per week (30 min per time)     Recommend increasing exercise to 3 days a week for at least 30 minutes.        Immunization History  Administered Date(s) Administered  . Hepatitis A, Adult 08/30/2017, 04/11/2018  . Influenza, High Dose Seasonal PF 08/09/2017, 07/24/2018  . Pneumococcal Conjugate-13 01/10/2018  . Tdap 08/09/2017  . Typhoid Inactivated 08/30/2017  . Zoster Recombinat (Shingrix) 02/07/2019, 06/06/2019     Health Maintenance  Topic Date Due  . PNA vac Low Risk Adult (2 of 2 - PPSV23) 01/11/2019  . INFLUENZA VACCINE  05/12/2019  . COLONOSCOPY  05/28/2020  . TETANUS/TDAP  08/10/2027  . Hepatitis C Screening  Completed     Discussed health benefits of physical activity, and encouraged him to engage in regular exercise appropriate for his age and condition.   He was vaccinated on 06/06/2019 with second shingles dose. Will schedule flu and pneumonia shots to be one month out, nurse only visit.   2. Palpitations  - TSH  3. Hyperlipidemia, unspecified hyperlipidemia type  - Comprehensive Metabolic Panel (CMET) - CBC with Differential - Lipid Profile  4. Thrombocytopathia ([place])  Would not recommend donating platelets.   5. Tremor  Could try switching metoprolol to propranolol. He is comfortable with metoprolol right now but would consider switching in the future.   The entirety of the information documented in the History of Present Illness, Review of Systems and Physical Exam were personally obtained by me. Portions of this information were initially documented by Ashley Royalty, CMA and reviewed by me for thoroughness and accuracy.   F/u 1 year CPE ------------------------------------------------------------------------------------------------------------    Trinna Post, PA-C  [facility]

## 2019-06-13 NOTE — Patient Instructions (Signed)
Health Maintenance After Age 71 After age 71, you are at a higher risk for certain long-term diseases and infections as well as injuries from falls. Falls are a major cause of broken bones and head injuries in people who are older than age 71. Getting regular preventive care can help to keep you healthy and well. Preventive care includes getting regular testing and making lifestyle changes as recommended by your health care provider. Talk with your health care provider about:  Which screenings and tests you should have. A screening is a test that checks for a disease when you have no symptoms.  A diet and exercise plan that is right for you. What should I know about screenings and tests to prevent falls? Screening and testing are the best ways to find a health problem early. Early diagnosis and treatment give you the best chance of managing medical conditions that are common after age 71. Certain conditions and lifestyle choices may make you more likely to have a fall. Your health care provider may recommend:  Regular vision checks. Poor vision and conditions such as cataracts can make you more likely to have a fall. If you wear glasses, make sure to get your prescription updated if your vision changes.  Medicine review. Work with your health care provider to regularly review all of the medicines you are taking, including over-the-counter medicines. Ask your health care provider about any side effects that may make you more likely to have a fall. Tell your health care provider if any medicines that you take make you feel dizzy or sleepy.  Osteoporosis screening. Osteoporosis is a condition that causes the bones to get weaker. This can make the bones weak and cause them to break more easily.  Blood pressure screening. Blood pressure changes and medicines to control blood pressure can make you feel dizzy.  Strength and balance checks. Your health care provider may recommend certain tests to check your  strength and balance while standing, walking, or changing positions.  Foot health exam. Foot pain and numbness, as well as not wearing proper footwear, can make you more likely to have a fall.  Depression screening. You may be more likely to have a fall if you have a fear of falling, feel emotionally low, or feel unable to do activities that you used to do.  Alcohol use screening. Using too much alcohol can affect your balance and may make you more likely to have a fall. What actions can I take to lower my risk of falls? General instructions  Talk with your health care provider about your risks for falling. Tell your health care provider if: ? You fall. Be sure to tell your health care provider about all falls, even ones that seem minor. ? You feel dizzy, sleepy, or off-balance.  Take over-the-counter and prescription medicines only as told by your health care provider. These include any supplements.  Eat a healthy diet and maintain a healthy weight. A healthy diet includes low-fat dairy products, low-fat (lean) meats, and fiber from whole grains, beans, and lots of fruits and vegetables. Home safety  Remove any tripping hazards, such as rugs, cords, and clutter.  Install safety equipment such as grab bars in bathrooms and safety rails on stairs.  Keep rooms and walkways well-lit. Activity   Follow a regular exercise program to stay fit. This will help you maintain your balance. Ask your health care provider what types of exercise are appropriate for you.  If you need a cane or   walker, use it as recommended by your health care provider.  Wear supportive shoes that have nonskid soles. Lifestyle  Do not drink alcohol if your health care provider tells you not to drink.  If you drink alcohol, limit how much you have: ? 0-1 drink a day for women. ? 0-2 drinks a day for men.  Be aware of how much alcohol is in your drink. In the [place] one drink equals one typical bottle of beer (12  oz), one-half glass of wine (5 oz), or one shot of hard liquor (1 oz).  Do not use any products that contain nicotine or tobacco, such as cigarettes and e-cigarettes. If you need help quitting, ask your health care provider. Summary  Having a healthy lifestyle and getting preventive care can help to protect your health and wellness after age 71.  Screening and testing are the best way to find a health problem early and help you avoid having a fall. Early diagnosis and treatment give you the best chance for managing medical conditions that are more common for people who are older than age 71.  Falls are a major cause of broken bones and head injuries in people who are older than age 71. Take precautions to prevent a fall at home.  Work with your health care provider to learn what changes you can make to improve your health and wellness and to prevent falls. This information is not intended to replace advice given to you by your health care provider. Make sure you discuss any questions you have with your health care provider. Document Released: 08/10/2017 Document Revised: 01/18/2019 Document Reviewed: 08/10/2017 Elsevier Patient Education  2020 Elsevier Inc.  

## 2019-06-14 ENCOUNTER — Telehealth: Payer: Self-pay

## 2019-06-14 LAB — CBC WITH DIFFERENTIAL/PLATELET
Basophils Absolute: 0 10*3/uL (ref 0.0–0.2)
Basos: 0 %
EOS (ABSOLUTE): 0.1 10*3/uL (ref 0.0–0.4)
Eos: 1 %
Hematocrit: 39 % (ref 37.5–51.0)
Hemoglobin: 13.7 g/dL (ref 13.0–17.7)
Immature Grans (Abs): 0 10*3/uL (ref 0.0–0.1)
Immature Granulocytes: 0 %
Lymphocytes Absolute: 1.1 10*3/uL (ref 0.7–3.1)
Lymphs: 24 %
MCH: 30.4 pg (ref 26.6–33.0)
MCHC: 35.1 g/dL (ref 31.5–35.7)
MCV: 87 fL (ref 79–97)
Monocytes Absolute: 0.3 10*3/uL (ref 0.1–0.9)
Monocytes: 7 %
Neutrophils Absolute: 3.1 10*3/uL (ref 1.4–7.0)
Neutrophils: 68 %
Platelets: 138 10*3/uL — ABNORMAL LOW (ref 150–450)
RBC: 4.5 x10E6/uL (ref 4.14–5.80)
RDW: 11.5 % — ABNORMAL LOW (ref 11.6–15.4)
WBC: 4.6 10*3/uL (ref 3.4–10.8)

## 2019-06-14 LAB — COMPREHENSIVE METABOLIC PANEL
ALT: 15 IU/L (ref 0–44)
AST: 15 IU/L (ref 0–40)
Albumin/Globulin Ratio: 1.7 (ref 1.2–2.2)
Albumin: 4.5 g/dL (ref 3.7–4.7)
Alkaline Phosphatase: 64 IU/L (ref 39–117)
BUN/Creatinine Ratio: 16 (ref 10–24)
BUN: 15 mg/dL (ref 8–27)
Bilirubin Total: 0.6 mg/dL (ref 0.0–1.2)
CO2: 23 mmol/L (ref 20–29)
Calcium: 9.3 mg/dL (ref 8.6–10.2)
Chloride: 102 mmol/L (ref 96–106)
Creatinine, Ser: 0.94 mg/dL (ref 0.76–1.27)
GFR calc Af Amer: 94 mL/min/{1.73_m2} (ref >59)
GFR calc non Af Amer: 81 mL/min/{1.73_m2} (ref >59)
Globulin, Total: 2.6 g/dL (ref 1.5–4.5)
Glucose: 105 mg/dL — ABNORMAL HIGH (ref 65–99)
Potassium: 4.5 mmol/L (ref 3.5–5.2)
Sodium: 137 mmol/L (ref 134–144)
Total Protein: 7.1 g/dL (ref 6.0–8.5)

## 2019-06-14 LAB — LIPID PANEL
Chol/HDL Ratio: 3.9 ratio (ref 0.0–5.0)
Cholesterol, Total: 186 mg/dL (ref 100–199)
HDL: 48 mg/dL (ref >39)
LDL Chol Calc (NIH): 127 mg/dL — ABNORMAL HIGH (ref 0–99)
Triglycerides: 60 mg/dL (ref 0–149)
VLDL Cholesterol Cal: 11 mg/dL (ref 5–40)

## 2019-06-14 LAB — TSH: TSH: 0.288 u[IU]/mL — ABNORMAL LOW (ref 0.450–4.500)

## 2019-06-14 NOTE — Telephone Encounter (Signed)
-----   Message from Trinna Post, [place] sent at 06/14/2019  9:20 AM EDT ----- Can we add Free T3 and T4 under low TSH? Can we also add a1c under hyperglycemia? Thanks.

## 2019-06-14 NOTE — Telephone Encounter (Signed)
Labs added.

## 2019-06-14 NOTE — Addendum Note (Signed)
Addended by: Trinna Post on: 06/14/2019 03:35 PM   Modules accepted: Level of Service

## 2019-06-15 ENCOUNTER — Telehealth: Payer: Self-pay

## 2019-06-15 NOTE — Telephone Encounter (Signed)
Pt advised.   Thanks,   -Aalia Greulich  

## 2019-06-15 NOTE — Telephone Encounter (Signed)
-----   Message from Mar Daring, [place] sent at 06/15/2019 10:46 AM EDT ----- Is A1c is normal and TSH is overactive, but T3 and T4 are normal. TSH has been stable for at least 2 years. Could consider thyroid US if having symptoms (heart racing, heat intolerance, anxiety)

## 2019-06-27 LAB — TSH+T4F+T3FREE
Free T4: 1.52 ng/dL (ref 0.82–1.77)
T3, Free: 2.9 pg/mL (ref 2.0–4.4)
TSH: 0.288 u[IU]/mL — ABNORMAL LOW (ref 0.450–4.500)

## 2019-06-27 LAB — HEMOGLOBIN A1C
Est. average glucose Bld gHb Est-mCnc: 100 mg/dL
Hgb A1c MFr Bld: 5.1 % (ref 4.8–5.6)

## 2019-06-27 LAB — SPECIMEN STATUS REPORT

## 2019-07-06 DIAGNOSIS — H2513 Age-related nuclear cataract, bilateral: Secondary | ICD-10-CM | POA: Diagnosis not present

## 2019-07-24 ENCOUNTER — Other Ambulatory Visit: Payer: Self-pay

## 2019-07-24 ENCOUNTER — Encounter: Payer: Self-pay | Admitting: Physician Assistant

## 2019-07-24 ENCOUNTER — Ambulatory Visit (INDEPENDENT_AMBULATORY_CARE_PROVIDER_SITE_OTHER): Payer: Medicare Other | Admitting: Physician Assistant

## 2019-07-24 VITALS — BP 123/77 | HR 84 | Temp 96.8°F | Resp 16 | Ht 72.0 in | Wt 165.0 lb

## 2019-07-24 DIAGNOSIS — Z23 Encounter for immunization: Secondary | ICD-10-CM

## 2019-07-24 DIAGNOSIS — E0590 Thyrotoxicosis, unspecified without thyrotoxic crisis or storm: Secondary | ICD-10-CM | POA: Diagnosis not present

## 2019-07-24 DIAGNOSIS — I471 Supraventricular tachycardia, unspecified: Secondary | ICD-10-CM

## 2019-07-24 DIAGNOSIS — E785 Hyperlipidemia, unspecified: Secondary | ICD-10-CM

## 2019-07-24 NOTE — Progress Notes (Signed)
Patient: Andrew [name] Male    DOB: Nov 21, 1945   73 y.o.   MRN: [id number] Visit Date: 07/24/2019  Today's Provider: Trinna Post, PA-C   Chief Complaint  Patient presents with  . Follow-up   Subjective:     HPI   Hyperlipidemia, unspecified hyperlipidemia type From 01/10/2017-labs ordered, no changes. Labs also done on 06/13/2019, reccommended patient begin taking a statin.  Lipid Panel     Component Value Date/Time   CHOL 186 06/13/2019 1116   TRIG 60 06/13/2019 1116   HDL 48 06/13/2019 1116   CHOLHDL 3.9 06/13/2019 1116   CHOLHDL 3.9 02/24/2017 0818   VLDL 10 02/24/2017 0818   LDLCALC 127 (H) 06/13/2019 1116   LABVLDL 11 06/13/2019 1116   The CVD Risk score (D'Agostino, et al., 2008) failed to calculate for the following reasons:   CVD risk score not calculated    Low TSH level From 01/10/2017-Patient denying symptoms today, would like to wait on follow up lab. Labs also done on 06/13/2019. Initial thyroid lab from 06/13/2019, shows possible overactive thyroid though your follow up labs are normal. Recommended patient be seen by an endocrinologist.  He reports his parents have a history of thyroid issues and also with tremor.   Essential tremor From 01/10/2017-Presentation consistent with essential tremor. Counseled that this is generally benign, beta blockers can be helpful but if he does not wish to do anything about it, he does not have to.  He has been seeing a cardiologist and continues to have palpitations occasionally despite being on a beta blocker.   The ASCVD Risk score Mikey Bussing DC Brooke Bonito., et al., 2013) failed to calculate for the following reasons:   Unable to determine if patient is Non-Hispanic African American  No Known Allergies   Current Outpatient Medications:  .  aspirin EC 81 MG tablet, Take 1 tablet (81 mg total) by mouth daily., Disp: 90 tablet, Rfl: 3 .  cholecalciferol (VITAMIN D) 1000 UNITS tablet, Take 1,000 Units by mouth daily., Disp:  , Rfl:  .  metoprolol succinate (TOPROL-XL) 25 MG 24 hr tablet, Take 1 tablet (25 mg total) by mouth daily., Disp: 90 tablet, Rfl: 3 .  Multiple Vitamin (MULTI-VITAMIN DAILY) TABS, Take 1 tablet by mouth daily. , Disp: , Rfl:  .  saw palmetto 160 MG capsule, Take 160 mg by mouth every other day. , Disp: , Rfl:  .  SHINGRIX injection, , Disp: , Rfl:  .  Turmeric 450 MG CAPS, Take 450 mg by mouth daily. , Disp: , Rfl:   Review of Systems  Constitutional: Negative for appetite change, chills and fever.  Respiratory: Negative for chest tightness, shortness of breath and wheezing.   Cardiovascular: Negative for chest pain and palpitations.  Gastrointestinal: Negative for abdominal pain, nausea and vomiting.    Social History   Tobacco Use  . Smoking status: Never Smoker  . Smokeless tobacco: Never Used  Substance Use Topics  . Alcohol use: Yes    Alcohol/week: 4.0 standard drinks    Types: 1 Glasses of wine, 1 Shots of liquor, 2 Standard drinks or equivalent per week    Comment: 1 GLASS WINE WEEKLY/ MARGARITA OCC      Objective:   BP 123/77 (BP Location: Right Arm, Patient Position: Sitting, Cuff Size: Large)   Pulse 84   Temp (!) 96.8 F (36 C) (Other (Comment))   Resp 16   Ht 6' (1.829 m)   Wt 165 lb (  74.8 kg)   SpO2 96%   BMI 22.38 kg/m  Vitals:   07/24/19 1037  BP: 123/77  Pulse: 84  Resp: 16  Temp: (!) 96.8 F (36 C)  TempSrc: Other (Comment)  SpO2: 96%  Weight: 165 lb (74.8 kg)  Height: 6' (1.829 m)  Body mass index is 22.38 kg/m.   Physical Exam Constitutional:      Appearance: Normal appearance.  Cardiovascular:     Rate and Rhythm: Normal rate and regular rhythm.     Heart sounds: Normal heart sounds.  Pulmonary:     Effort: Pulmonary effort is normal.     Breath sounds: Normal breath sounds.  Skin:    General: Skin is warm and dry.  Neurological:     Mental Status: He is alert and oriented to person, place, and time. Mental status is at baseline.   Psychiatric:        Mood and Affect: Mood normal.        Behavior: Behavior normal.      No results found for any visits on 07/24/19.     Assessment & Plan    1. Subclinical hyperthyroidism  He has had subclinical hyperthyroidism for the past several years. In light of tremor and palpitations, I think it would be a good idea for him to get a further evaluation with an endocrinologist. Have placed this referral today.   - Ambulatory referral to Endocrinology  2. Need for pneumococcal vaccine  - Pneumococcal polysaccharide vaccine 23-valent greater than or equal to 2yo subcutaneous/IM  3. Need for influenza vaccination  - Flu Vaccine QUAD High Dose(Fluad)  4. PSVT (paroxysmal supraventricular tachycardia) ([place])   5. Hyperlipidemia, unspecified hyperlipidemia type  Have gone over his CVD risk, which is approximately ~18% and the recommendations to start a statin to reduce the risk of MI or stroke. Counseled that even if he were significantly able to lower his cholesterol through lifestyle modifications, he would still fall into a high CVD risk category based on his combined risk factors. He would like to research statin medications and touch base at next physical. If he decides he would like to start the medication sooner, he can contact the clinic and we will schedule appropriate follow up.  The entirety of the information documented in the History of Present Illness, Review of Systems and Physical Exam were personally obtained by me. Portions of this information were initially documented by April M. Sabra Heck, CMA and reviewed by me for thoroughness and accuracy.   F/u PRN      Trinna Post, PA-C  [facility]

## 2019-07-24 NOTE — Patient Instructions (Signed)

## 2019-11-23 ENCOUNTER — Ambulatory Visit: Payer: Medicare Other | Attending: Internal Medicine

## 2019-11-23 DIAGNOSIS — Z23 Encounter for immunization: Secondary | ICD-10-CM

## 2019-11-23 NOTE — Progress Notes (Signed)
   Covid-19 Vaccination Clinic  Name:  Andrew [name]    MRN: [id number] DOB: 13-Nov-1945  11/23/2019  Andrew [name] was observed post Covid-19 immunization for 15 minutes without incidence. He was provided with Vaccine Information Sheet and instruction to access the V-Safe system.   Andrew [name] was instructed to call 911 with any severe reactions post vaccine: Marland Kitchen Difficulty breathing  . Swelling of your face and throat  . A fast heartbeat  . A bad rash all over your body  . Dizziness and weakness    Immunizations Administered    Name Date Dose VIS Date Route   Pfizer COVID-19 Vaccine 11/23/2019  9:16 AM 0.3 mL 09/21/2019 Intramuscular   Manufacturer: [place]   Lot: [id number]   [place]: [id number]

## 2019-12-06 ENCOUNTER — Other Ambulatory Visit: Payer: Self-pay | Admitting: *Deleted

## 2019-12-06 DIAGNOSIS — R0789 Other chest pain: Secondary | ICD-10-CM

## 2019-12-06 DIAGNOSIS — R002 Palpitations: Secondary | ICD-10-CM

## 2019-12-06 MED ORDER — METOPROLOL SUCCINATE ER 25 MG PO TB24
25.0000 mg | ORAL_TABLET | Freq: Every day | ORAL | 0 refills | Status: DC
Start: 2019-12-06 — End: 2020-01-23

## 2019-12-06 NOTE — Telephone Encounter (Signed)
Requested Prescriptions   Signed Prescriptions Disp Refills  . metoprolol succinate (TOPROL-XL) 25 MG 24 hr tablet 90 tablet 0    Sig: Take 1 tablet (25 mg total) by mouth daily.    Authorizing Provider: Theora Gianotti    Ordering User: Britt Bottom

## 2019-12-11 DIAGNOSIS — L821 Other seborrheic keratosis: Secondary | ICD-10-CM | POA: Diagnosis not present

## 2019-12-11 DIAGNOSIS — Z85828 Personal history of other malignant neoplasm of skin: Secondary | ICD-10-CM | POA: Diagnosis not present

## 2019-12-11 DIAGNOSIS — L570 Actinic keratosis: Secondary | ICD-10-CM | POA: Diagnosis not present

## 2019-12-11 DIAGNOSIS — L820 Inflamed seborrheic keratosis: Secondary | ICD-10-CM | POA: Diagnosis not present

## 2019-12-11 DIAGNOSIS — L578 Other skin changes due to chronic exposure to nonionizing radiation: Secondary | ICD-10-CM | POA: Diagnosis not present

## 2019-12-11 DIAGNOSIS — D485 Neoplasm of uncertain behavior of skin: Secondary | ICD-10-CM | POA: Diagnosis not present

## 2019-12-11 DIAGNOSIS — Z1283 Encounter for screening for malignant neoplasm of skin: Secondary | ICD-10-CM | POA: Diagnosis not present

## 2019-12-18 ENCOUNTER — Ambulatory Visit: Payer: Medicare Other | Attending: Internal Medicine

## 2019-12-18 DIAGNOSIS — Z23 Encounter for immunization: Secondary | ICD-10-CM

## 2019-12-18 NOTE — Progress Notes (Signed)
   Covid-19 Vaccination Clinic  Name:  Andrew [name]    MRN: [id number] DOB: 1946-06-25  12/18/2019  [name] was observed post Covid-19 immunization for 15 minutes without incident. He was provided with Vaccine Information Sheet and instruction to access the V-Safe system.   [name] was instructed to call 911 with any severe reactions post vaccine: Marland Kitchen Difficulty breathing  . Swelling of face and throat  . A fast heartbeat  . A bad rash all over body  . Dizziness and weakness   Immunizations Administered    Name Date Dose VIS Date Route   Pfizer COVID-19 Vaccine 12/18/2019  9:55 AM 0.3 mL 09/21/2019 Intramuscular   Manufacturer: [place]   Lot: [id number]   [place]: [id number]

## 2020-01-22 NOTE — Progress Notes (Signed)
Follow-up Outpatient Visit Date: 01/23/2020  Primary Care Provider: Trinna Post, PA-C [place] [address]  Chief Complaint: Palpitations and chest pain  HPI:  Andrew [name] is a 74 y.o. male with history of atypical chest pain, palpitations with monitoring showing PSVT and NSVT, and anxiety, who presents for follow-up of palpitations and chest pain.  We last spoke via virtual visit in 01/2019, at which time Andrew [name] reported sporadic episodes of palpitations and chest discomfort, usually at rest.  We did not make any medication changes or pursue additional testing at that time.  Today, Andrew [name] reports that he has been feeling more frequent palpitations and chest pain.  The two seem unrelated.  He recently donated blood, which is not unusual for him, and experienced mild chest discomfort afterwards.  He also felt very tired lethargic for the next 2 days.  That has since resolved.  He saw an endocrinologist in December, who did not feel that further intervention was needed for his mildly suppressed TSH with normal free T4.  Andrew [name] continues to be active and does not report exertional chest discomfort.  --------------------------------------------------------------------------------------------------  Past Medical History:  Diagnosis Date  . Atypical chest pain    a. Rest pain only; b. 08/2016 ETT: Ex time 30mins, no ST/T changes ->nl study.  . Headache    H/O MIGRAINES  . History of anxiety   . Hypertension   . NSVT (nonsustained ventricular tachycardia) ([place])    a. 03/2017 Zio Monitor: 2 episodes of NSVT - up to 7 beats, max HR 207.  Marland Kitchen Palpitations    a. 03/2017 14 day Zio Monitor: PACs, PVCs, brief PSVT and NSVT-->managed w/ beta [name].  . Precancerous lesion   . PSVT (paroxysmal supraventricular tachycardia) ([place])    a. 03/2017  Zio monitor: 7 episodes of PSVT - up to 6 beats, max rate 193.  . Valvular heart disease    a. 06/2017 Echo:  EF 60-65%. No rwma. Mild MR. Mild to mod TR. PASP 29mmHg.   Past Surgical History:  Procedure Laterality Date  . EYE SURGERY Right    Done as a child  . INGUINAL HERNIA REPAIR Right 03/20/2018   Large Ultrapro mesh.  Surgeon: Robert Bellow, MD;  Location: [facility] ORS;  Service: General;  Laterality: Right;  Marland Kitchen VASECTOMY     30 yrs ago?    Current Meds  Medication Sig  . aspirin EC 81 MG tablet Take 1 tablet (81 mg total) by mouth daily.  . cholecalciferol (VITAMIN D) 1000 UNITS tablet Take 1,000 Units by mouth daily.  . Multiple Vitamin (MULTI-VITAMIN DAILY) TABS Take 1 tablet by mouth daily.   . saw palmetto 160 MG capsule Take 160 mg by mouth every other day.   Marland Kitchen SHINGRIX injection   . Turmeric 450 MG CAPS Take 450 mg by mouth daily.   . [DISCONTINUED] metoprolol succinate (TOPROL-XL) 25 MG 24 hr tablet Take 1 tablet (25 mg total) by mouth daily.    Allergies: Patient has no known allergies.  Social History   Tobacco Use  . Smoking status: Never Smoker  . Smokeless tobacco: Never Used  Substance Use Topics  . Alcohol use: Yes    Alcohol/week: 4.0 standard drinks    Types: 1 Glasses of wine, 1 Shots of liquor, 2 Standard drinks or equivalent per week    Comment: 1 GLASS WINE WEEKLY/ MARGARITA OCC  . Drug use: No    Family History  Problem  Relation Age of Onset  . Heart attack Father     Review of Systems: Andrew [name] notes intermittent essential tremor and wonders if switching beta-blockers might be helpful, as advised by his PCP.  Otherwise, a 12-system review of systems was negative except as noted in the HPI.  --------------------------------------------------------------------------------------------------  Physical Exam: BP (!) 144/84 (BP Location: Left Arm, Patient Position: Sitting, Cuff Size: Normal)   Pulse 89   Ht 6' (1.829 m)   Wt 165 lb (74.8 kg)   SpO2 98%   BMI 22.38 kg/m   General: NAD. HEENT: No conjunctival pallor or scleral icterus.  Facemask in place. Neck: No JVD or HJR. Lungs: Normal work of breathing. Clear to auscultation bilaterally without wheezes or crackles. Heart: Regular rate and rhythm without murmurs, rubs, or gallops. Non-displaced PMI. Abd: Bowel sounds present. Soft, NT/ND without hepatosplenomegaly Ext: No lower extremity edema. Neuro: Fine intention tremor noted in both hands.  EKG: Normal sinus rhythm without significant abnormality.  No significant change since 03/13/2018.  Lab Results  Component Value Date   WBC 4.6 06/13/2019   HGB 13.7 06/13/2019   HCT 39.0 06/13/2019   MCV 87 06/13/2019   PLT 138 (L) 06/13/2019    Lab Results  Component Value Date   NA 137 06/13/2019   K 4.5 06/13/2019   CL 102 06/13/2019   CO2 23 06/13/2019   BUN 15 06/13/2019   CREATININE 0.94 06/13/2019   GLUCOSE 105 (H) 06/13/2019   ALT 15 06/13/2019    Lab Results  Component Value Date   CHOL 186 06/13/2019   HDL 48 06/13/2019   LDLCALC 127 (H) 06/13/2019   TRIG 60 06/13/2019   CHOLHDL 3.9 06/13/2019    --------------------------------------------------------------------------------------------------  ASSESSMENT AND PLAN: Chest pain: Ms. [name] notes occasional chest pain, including after recent blood donation, which seems slightly more frequent than when we last spoke.  Interestingly, he is able to do strenuous activities without significant symptoms.  Nonetheless, given progression of disease, I think that further ischemia evaluation should be undertaken.  Exercise tolerance test in 2017 was notable for poor functional capacity.  I recommended that we perform a cardiac CTA for further evaluation.  If this is cost prohibitive, we could pursue pharmacologic myocardial perfusion stress test instead.  In the meantime, I will transition from metoprolol succinate to extended release propranolol 80 mg daily for antianginal therapy as well as to help with Andrew [name] essential tremor.  We will continue  low-dose aspirin.  Hyperlipidemia: LDL mildly elevated on recent check.  Andrew [name] is reluctant to begin statin therapy.  We have agreed to defer pharmacotherapy pending aforementioned CTA versus myocardial perfusion stress test.  If there is evidence of ASCVD, statin therapy will need to be strongly considered.  Palpitations: Symptoms slightly more pronounced than when we last spoke.  We will transition from metoprolol to propranolol.  Hypertension: Blood pressure mildly elevated today.  We will switch metoprolol to propranolol, as above.  Continue to work on lifestyle modifications.  Central tremor: Faint tremor noted on exam today.  We will transition metoprolol succinate to propranolol today.  Follow-up: Return to clinic in 6 months, sooner if significant abnormality noted on CTA/MPI.  [name], MD 01/23/2020 1:05 PM

## 2020-01-23 ENCOUNTER — Other Ambulatory Visit: Payer: Self-pay

## 2020-01-23 ENCOUNTER — Ambulatory Visit (INDEPENDENT_AMBULATORY_CARE_PROVIDER_SITE_OTHER): Payer: Medicare Other | Admitting: Internal Medicine

## 2020-01-23 ENCOUNTER — Encounter: Payer: Self-pay | Admitting: Internal Medicine

## 2020-01-23 VITALS — BP 144/84 | HR 89 | Ht 72.0 in | Wt 165.0 lb

## 2020-01-23 DIAGNOSIS — I10 Essential (primary) hypertension: Secondary | ICD-10-CM

## 2020-01-23 DIAGNOSIS — E7800 Pure hypercholesterolemia, unspecified: Secondary | ICD-10-CM | POA: Diagnosis not present

## 2020-01-23 DIAGNOSIS — R079 Chest pain, unspecified: Secondary | ICD-10-CM | POA: Diagnosis not present

## 2020-01-23 DIAGNOSIS — R002 Palpitations: Secondary | ICD-10-CM

## 2020-01-23 MED ORDER — METOPROLOL TARTRATE 25 MG PO TABS: 25.0000 mg | ORAL_TABLET | Freq: Once | ORAL | 0 refills | Status: DC

## 2020-01-23 MED ORDER — PROPRANOLOL HCL ER 80 MG PO CP24: 80.0000 mg | ORAL_CAPSULE | Freq: Every day | ORAL | 2 refills | Status: DC

## 2020-01-23 NOTE — Patient Instructions (Signed)
Medication Instructions:  Your physician has recommended you make the following change in your medication:  1- STOP Metoprolol. 2- START Propranolol 80 mg (1 tablet) by mouth once a day.  3- Prior to CT take metoprolol tartrate 25 mg by mouth ONCE, 2 hours prior to CT.   *If you need a refill on your cardiac medications before your next appointment, please call your pharmacy*   Lab Work: none If you have labs (blood work) drawn today and your tests are completely normal, you will receive your results only by: Marland Kitchen MyChart Message (if you have MyChart) OR . A paper copy in the mail If you have any lab test that is abnormal or we need to change your treatment, we will call you to review the results.   Testing/Procedures:  Your cardiac CT will be scheduled at one of the below locations:   [facility] [address] [place], [place] [address] [phone]  [place] [address] [place], [place] [address] [phone]  If scheduled at [facility], please arrive at the [facility] main entrance of [facility] 30 minutes prior to test start time. Proceed to the [facility] Radiology Department (first floor) to check-in and test prep.  If scheduled at [facility], please arrive 15 mins early for check-in and test prep.  Please follow these instructions carefully (unless otherwise directed):  Hold all erectile dysfunction medications at least 3 days (72 hrs) prior to test.  On the Night Before the Test: . Be sure to Drink plenty of water. . Do not consume any caffeinated/decaffeinated beverages or chocolate 12 hours prior to your test. . Do not take any antihistamines 12 hours prior to your test.  On the Day of the Test: . Drink plenty of water. Do not drink any water within one hour of the test. . Do not eat any food 4 hours prior to the test. . You may take your  regular medications prior to the test.  . Take metoprolol (Lopressor) two hours prior to test.       After the Test: . Drink plenty of water. . After receiving IV contrast, you may experience a mild flushed feeling. This is normal. . On occasion, you may experience a mild rash up to 24 hours after the test. This is not dangerous. If this occurs, you can take Benadryl 25 mg and increase your fluid intake. . If you experience trouble breathing, this can be serious. If it is severe call 911 IMMEDIATELY. If it is mild, please call our office. . If you take any of these medications: Glipizide/Metformin, Avandament, Glucavance, please do not take 48 hours after completing test unless otherwise instructed.   Once we have confirmed authorization from your insurance company, we will call you to set up a date and time for your test.   For non-scheduling related questions, please contact the cardiac imaging nurse navigator should you have any questions/concerns: Marchia Bond, RN Navigator Cardiac Imaging [facility] [phone] office  For scheduling needs, including cancellations and rescheduling, please call [phone].      Follow-Up: At [facility], you and your health needs are our priority.  As part of our continuing mission to provide you with exceptional heart care, we have created designated Provider Care Teams.  These Care Teams include your primary Cardiologist (physician) and Advanced Practice Providers (APPs -  Physician Assistants and Nurse Practitioners)  who all work together to provide you with the care you need, when you need it.  We recommend signing up for the patient portal called "MyChart".  Sign up information is provided on this After Visit Summary.  MyChart is used to connect with patients for Virtual Visits (Telemedicine).  Patients are able to view lab/test results, encounter notes, upcoming appointments, etc.  Non-urgent messages can be sent  to your provider as well.   To learn more about what you can do with MyChart, go to [email or web address].    Your next appointment:   6 month(s)  The format for your next appointment:   In Person  Provider:    You may see [name], MD or one of the following Advanced Practice Providers on your designated Care Team:    Murray Hodgkins, NP  Christell Faith, PA-C  Marrianne Mood, PA-C    Cardiac CT Angiogram A cardiac CT angiogram is a procedure to look at the heart and the area around the heart. It may be done to help find the cause of chest pains or other symptoms of heart disease. During this procedure, a substance called contrast dye is injected into the blood vessels in the area to be checked. A large X-ray machine, called a CT scanner, then takes detailed pictures of the heart and the surrounding area. The procedure is also sometimes called a coronary CT angiogram, coronary artery scanning, or CTA. A cardiac CT angiogram allows the health care provider to see how well blood is flowing to and from the heart. The health care provider will be able to see if there are any problems, such as:  Blockage or narrowing of the coronary arteries in the heart.  Fluid around the heart.  Signs of weakness or disease in the muscles, valves, and tissues of the heart. Tell a health care provider about:  Any allergies you have. This is especially important if you have had a previous allergic reaction to contrast dye.  All medicines you are taking, including vitamins, herbs, eye drops, creams, and over-the-counter medicines.  Any blood disorders you have.  Any surgeries you have had.  Any medical conditions you have.  Whether you are pregnant or may be pregnant.  Any anxiety disorders, chronic pain, or other conditions you have that may increase your stress or prevent you from lying still. What are the risks? Generally, this is a safe procedure. However, problems may occur,  including:  Bleeding.  Infection.  Allergic reactions to medicines or dyes.  Damage to other structures or organs.  Kidney damage from the contrast dye that is used.  Increased risk of cancer from radiation exposure. This risk is low. Talk with your health care provider about: ? The risks and benefits of testing. ? How you can receive the lowest dose of radiation. What happens before the procedure?  Wear comfortable clothing and remove any jewelry, glasses, dentures, and hearing aids.  Follow instructions from your health care provider about eating and drinking. This may include: ? For 12 hours before the procedure -- avoid caffeine. This includes tea, coffee, soda, energy drinks, and diet pills. Drink plenty of water or other fluids that do not have caffeine in them. Being well hydrated can prevent complications. ? For 4-6 hours before the procedure -- stop eating and drinking. The contrast dye can cause nausea, but this is less likely if your stomach is empty.  Ask your health care provider about changing or stopping your regular medicines. This is especially  important if you are taking diabetes medicines, blood thinners, or medicines to treat problems with erections (erectile dysfunction). What happens during the procedure?   Hair on your chest may need to be removed so that small sticky patches called electrodes can be placed on your chest. These will transmit information that helps to monitor your heart during the procedure.  An IV will be inserted into one of your veins.  You might be given a medicine to control your heart rate during the procedure. This will help to ensure that good images are obtained.  You will be asked to lie on an exam table. This table will slide in and out of the CT machine during the procedure.  Contrast dye will be injected into the IV. You might feel warm, or you may get a metallic taste in your mouth.  You will be given a medicine called  nitroglycerin. This will relax or dilate the arteries in your heart.  The table that you are lying on will move into the CT machine tunnel for the scan.  The person running the machine will give you instructions while the scans are being done. You may be asked to: ? Keep your arms above your head. ? Hold your breath. ? Stay very still, even if the table is moving.  When the scanning is complete, you will be moved out of the machine.  The IV will be removed. The procedure may vary among health care providers and hospitals. What can I expect after the procedure? After your procedure, it is common to have:  A metallic taste in your mouth from the contrast dye.  A feeling of warmth.  A headache from the nitroglycerin. Follow these instructions at home:  Take over-the-counter and prescription medicines only as told by your health care provider.  If you are told, drink enough fluid to keep your urine pale yellow. This will help to flush the contrast dye out of your body.  Most people can return to their normal activities right after the procedure. Ask your health care provider what activities are safe for you.  It is up to you to get the results of your procedure. Ask your health care provider, or the department that is doing the procedure, when your results will be ready.  Keep all follow-up visits as told by your health care provider. This is important. Contact a health care provider if:  You have any symptoms of allergy to the contrast dye. These include: ? Shortness of breath. ? Rash or hives. ? A racing heartbeat. Summary  A cardiac CT angiogram is a procedure to look at the heart and the area around the heart. It may be done to help find the cause of chest pains or other symptoms of heart disease.  During this procedure, a large X-ray machine, called a CT scanner, takes detailed pictures of the heart and the surrounding area after a contrast dye has been injected into blood  vessels in the area.  Ask your health care provider about changing or stopping your regular medicines before the procedure. This is especially important if you are taking diabetes medicines, blood thinners, or medicines to treat erectile dysfunction.  If you are told, drink enough fluid to keep your urine pale yellow. This will help to flush the contrast dye out of your body. This information is not intended to replace advice given to you by your health care provider. Make sure you discuss any questions you have with your health care  provider. Document Revised: 05/23/2019 Document Reviewed: 05/23/2019 Elsevier Patient Education  2020 [employer].     Cardiac Nuclear Scan A cardiac nuclear scan is a test that measures blood flow to the heart when a person is resting and when he or she is exercising. The test looks for problems such as:  Not enough blood reaching a portion of the heart.  The heart muscle not working normally. You may need this test if:  You have heart disease.  You have had abnormal lab results.  You have had heart surgery or a balloon procedure to open up blocked arteries (angioplasty).  You have chest pain.  You have shortness of breath. In this test, a radioactive dye (tracer) is injected into your bloodstream. After the tracer has traveled to your heart, an imaging device is used to measure how much of the tracer is absorbed by or distributed to various areas of your heart. This procedure is usually done at a hospital and takes 2-4 hours. Tell a health care provider about:  Any allergies you have.  All medicines you are taking, including vitamins, herbs, eye drops, creams, and over-the-counter medicines.  Any problems you or family members have had with anesthetic medicines.  Any blood disorders you have.  Any surgeries you have had.  Any medical conditions you have.  Whether you are pregnant or may be pregnant. What are the risks? Generally, this is  a safe procedure. However, problems may occur, including:  Serious chest pain and heart attack. This is only a risk if the stress portion of the test is done.  Rapid heartbeat.  Sensation of warmth in your chest. This usually passes quickly.  Allergic reaction to the tracer. What happens before the procedure?  Ask your health care provider about changing or stopping your regular medicines. This is especially important if you are taking diabetes medicines or blood thinners.  Follow instructions from your health care provider about eating or drinking restrictions.  Remove your jewelry on the day of the procedure. What happens during the procedure?  An IV will be inserted into one of your veins.  Your health care provider will inject a small amount of radioactive tracer through the IV.  You will wait for 20-40 minutes while the tracer travels through your bloodstream.  Your heart activity will be monitored with an electrocardiogram (ECG).  You will lie down on an exam table.  Images of your heart will be taken for about 15-20 minutes.  You may also have a stress test. For this test, one of the following may be done: ? You will exercise on a treadmill or stationary bike. While you exercise, your heart's activity will be monitored with an ECG, and your blood pressure will be checked. ? You will be given medicines that will increase blood flow to parts of your heart. This is done if you are unable to exercise.  When blood flow to your heart has peaked, a tracer will again be injected through the IV.  After 20-40 minutes, you will get back on the exam table and have more images taken of your heart.  Depending on the type of tracer used, scans may need to be repeated 3-4 hours later.  Your IV line will be removed when the procedure is over. The procedure may vary among health care providers and hospitals. What happens after the procedure?  Unless your health care provider tells  you otherwise, you may return to your normal schedule, including diet, activities, and  medicines.  Unless your health care provider tells you otherwise, you may increase your fluid intake. This will help to flush the contrast dye from your body. Drink enough fluid to keep your urine pale yellow.  Ask your health care provider, or the department that is doing the test: ? When will my results be ready? ? How will I get my results? Summary  A cardiac nuclear scan measures the blood flow to the heart when a person is resting and when he or she is exercising.  Tell your health care provider if you are pregnant.  Before the procedure, ask your health care provider about changing or stopping your regular medicines. This is especially important if you are taking diabetes medicines or blood thinners.  After the procedure, unless your health care provider tells you otherwise, increase your fluid intake. This will help flush the contrast dye from your body.  After the procedure, unless your health care provider tells you otherwise, you may return to your normal schedule, including diet, activities, and medicines. This information is not intended to replace advice given to you by your health care provider. Make sure you discuss any questions you have with your health care provider. Document Revised: 03/13/2018 Document Reviewed: 03/13/2018 Elsevier Patient Education  [employer].

## 2020-02-05 ENCOUNTER — Telehealth (HOSPITAL_COMMUNITY): Payer: Self-pay | Admitting: Emergency Medicine

## 2020-02-05 ENCOUNTER — Other Ambulatory Visit
Admission: RE | Admit: 2020-02-05 | Discharge: 2020-02-05 | Disposition: A | Discharge status: Home / Self Care | Payer: Medicare Other | Source: Ambulatory Visit | Attending: Internal Medicine | Admitting: Internal Medicine

## 2020-02-05 DIAGNOSIS — R079 Chest pain, unspecified: Secondary | ICD-10-CM | POA: Insufficient documentation

## 2020-02-05 DIAGNOSIS — Z01810 Encounter for preprocedural cardiovascular examination: Secondary | ICD-10-CM

## 2020-02-05 LAB — BASIC METABOLIC PANEL
Anion gap: 7 (ref 5–15)
BUN: 19 mg/dL (ref 8–23)
CO2: 27 mmol/L (ref 22–32)
Calcium: 9 mg/dL (ref 8.9–10.3)
Chloride: 103 mmol/L (ref 98–111)
Creatinine, Ser: 0.99 mg/dL (ref 0.61–1.24)
GFR calc Af Amer: >60 mL/min (ref >60)
GFR calc non Af Amer: >60 mL/min (ref >60)
Glucose, Bld: 111 mg/dL — ABNORMAL HIGH (ref 70–99)
Potassium: 4.6 mmol/L (ref 3.5–5.1)
Sodium: 137 mmol/L (ref 135–145)

## 2020-02-05 NOTE — Telephone Encounter (Signed)
Reaching out to patient to offer assistance regarding upcoming cardiac imaging study; pt verbalizes understanding of appt date/time, parking situation and where to check in, pre-test NPO status and medications ordered, and verified current allergies; name and call back number provided for further questions should they arise Marchia Bond RN [facility] [phone] office [phone] cell   Pt states he was started on propanolol ER which has improved his daily HR/BPs. Today HR was 59bpm and was concerned about taking additional metoprolol dose for CTA. I instructed him to take BP/HR 2 hr prior to test and if HR > 60bpm, he will still need to take metoprolol but if not, we will give IV doses on site for HR control. Pt appreciated the information. Clarise Cruz

## 2020-02-05 NOTE — Addendum Note (Signed)
Addended by: Annia Belt on: 02/05/2020 10:20 AM   Modules accepted: Orders

## 2020-02-05 NOTE — Addendum Note (Signed)
Addended by: Sunday Spillers on: 02/05/2020 11:40 AM   Modules accepted: Orders

## 2020-02-05 NOTE — Telephone Encounter (Signed)
BMET order entered for the [facility]. Called patient and he would like to have at his [facility]. He has been in conversation with his PCP. He is in [place] and will make his way to his PCP's office to get the lab work.

## 2020-02-07 ENCOUNTER — Ambulatory Visit
Admission: RE | Admit: 2020-02-07 | Discharge: 2020-02-07 | Disposition: A | Discharge status: Home / Self Care | Payer: Medicare Other | Source: Ambulatory Visit | Attending: Internal Medicine | Admitting: Internal Medicine

## 2020-02-07 ENCOUNTER — Other Ambulatory Visit: Payer: Self-pay

## 2020-02-07 DIAGNOSIS — R079 Chest pain, unspecified: Secondary | ICD-10-CM | POA: Diagnosis not present

## 2020-02-07 MED ORDER — DILTIAZEM HCL 25 MG/5ML IV SOLN
10.0000 mg | Freq: Once | INTRAVENOUS | Status: AC
  Administered 2020-02-07: 10 mg via INTRAVENOUS

## 2020-02-07 MED ORDER — NITROGLYCERIN 0.4 MG SL SUBL: 0.8000 mg | SUBLINGUAL_TABLET | Freq: Once | SUBLINGUAL | Status: DC

## 2020-02-07 MED ORDER — METOPROLOL TARTRATE 5 MG/5ML IV SOLN: 10.0000 mg | Freq: Once | INTRAVENOUS | Status: DC

## 2020-02-07 MED ORDER — METOPROLOL TARTRATE 5 MG/5ML IV SOLN
10.0000 mg | Freq: Once | INTRAVENOUS | Status: AC
  Administered 2020-02-07: 15:00:00 10 mg via INTRAVENOUS

## 2020-02-07 MED ORDER — METOPROLOL TARTRATE 5 MG/5ML IV SOLN
10.0000 mg | Freq: Once | INTRAVENOUS | Status: AC
  Administered 2020-02-07: 10 mg via INTRAVENOUS

## 2020-02-07 MED ORDER — IOHEXOL 350 MG/ML SOLN: 75.0000 mL | Freq: Once | INTRAVENOUS | Status: DC | PRN

## 2020-02-07 NOTE — Progress Notes (Signed)
Patient unable to have CT due to patient's heart rate to high to perform the CT. Spoke with Dr Garen Lah and patient will need to be rescheduled. Notified Nurse Navigator Clarise Cruz. Patient ambulatory steady gait with nio complaints.

## 2020-02-13 ENCOUNTER — Telehealth: Payer: Self-pay | Admitting: *Deleted

## 2020-02-13 ENCOUNTER — Telehealth (HOSPITAL_COMMUNITY): Payer: Self-pay | Admitting: Emergency Medicine

## 2020-02-13 MED ORDER — METOPROLOL TARTRATE 100 MG PO TABS: 100.0000 mg | ORAL_TABLET | Freq: Once | ORAL | 0 refills | Status: DC

## 2020-02-13 NOTE — Telephone Encounter (Signed)
Reaching out to patient to offer assistance regarding upcoming cardiac imaging study; pt verbalizes understanding of appt date/time, parking situation and where to check in, pre-test NPO status and medications ordered, and verified current allergies; name and call back number provided for further questions should they arise Butch Otterson RN Navigator Cardiac Imaging [facility] [phone] office [phone] cell 

## 2020-02-13 NOTE — Telephone Encounter (Signed)
Attempted to call patient regarding upcoming cardiac CT appointment. °Left message on voicemail with name and callback number °Annaya Bangert RN Navigator Cardiac Imaging °[facility] °[phone] Office °[phone] Cell ° °

## 2020-02-13 NOTE — Telephone Encounter (Signed)
Received message from Jannet Askew with CT Cardiac scheduling for patient.  He is rescheduled for repeat Cardiac CT tomorrow 02/14/20 and needs premedication with a beta blocker. Discussed with Dr End who ordered for patient to take metoprolol tartrate 100 mg by mouth 2 hours prior to CT and make sure to have someone drive him as it may drop his BP.  Jannet Askew notified and will contact patient to let him know. Rx sent to pharmacy.

## 2020-02-14 ENCOUNTER — Ambulatory Visit
Admission: RE | Admit: 2020-02-14 | Discharge: 2020-02-14 | Disposition: A | Discharge status: Home / Self Care | Payer: Medicare Other | Source: Ambulatory Visit | Attending: Internal Medicine | Admitting: Internal Medicine

## 2020-02-14 ENCOUNTER — Other Ambulatory Visit: Payer: Self-pay

## 2020-02-14 DIAGNOSIS — K449 Diaphragmatic hernia without obstruction or gangrene: Secondary | ICD-10-CM | POA: Diagnosis not present

## 2020-02-14 DIAGNOSIS — R079 Chest pain, unspecified: Secondary | ICD-10-CM | POA: Diagnosis not present

## 2020-02-14 DIAGNOSIS — I700 Atherosclerosis of aorta: Secondary | ICD-10-CM | POA: Insufficient documentation

## 2020-02-14 MED ORDER — IOHEXOL 350 MG/ML SOLN
75.0000 mL | Freq: Once | INTRAVENOUS | Status: AC | PRN
  Administered 2020-02-14: 75 mL via INTRAVENOUS

## 2020-02-14 MED ORDER — METOPROLOL TARTRATE 5 MG/5ML IV SOLN: 5.0000 mg | INTRAVENOUS | Status: DC | PRN

## 2020-02-14 MED ORDER — NITROGLYCERIN 0.4 MG SL SUBL
0.8000 mg | SUBLINGUAL_TABLET | SUBLINGUAL | Status: DC | PRN
  Administered 2020-02-14: 0.8 mg via SUBLINGUAL

## 2020-02-15 ENCOUNTER — Telehealth: Payer: Self-pay | Admitting: *Deleted

## 2020-02-15 DIAGNOSIS — I700 Atherosclerosis of aorta: Secondary | ICD-10-CM

## 2020-02-15 HISTORY — DX: Atherosclerosis of aorta: I70.0

## 2020-02-15 NOTE — Telephone Encounter (Signed)
Patient and wife have viewed the results on MyChart. Patient not home at the moment, patient's wife is on the [facility].  They are concerned about taking omeprazole because the of side effects they read about. Wondering if they could take TUMS instead. Advised this may not help in the same way to address the GI issue but will seek advice from Dr End and let them know.

## 2020-02-15 NOTE — Telephone Encounter (Signed)
-----   Message from [name], MD sent at 02/15/2020 10:53 AM EDT ----- Please let Andrew [name] know that his cardiac CTA does not show evidence of any narrowing or blockage in his heart arteries.  A hiatal hernia was noted, which could be contributing to some of the chest pain that he has experienced.  I suggest that he stop taking aspirin and begin taking omeprazole 20 mg daily (this can be purchased over the counter).  It is reasonable to defer adding a statin at this time and to work on diet and exercise to help lower his cholesterol.  If he continues to have intermittent chest pain, he should speak with his PCP about referral to a gastroenterologist for further evaluation.  We will follow-up as planned in the fall.  I will forward these results to his PCP as well.

## 2020-02-19 NOTE — Telephone Encounter (Signed)
I will defer management of GERD/hiatal hernia to Mr. Towery's PCP.  If he wishes to use Tums or other as needed antacid, that is fine with me.  [name], MD [facility]

## 2020-02-19 NOTE — Telephone Encounter (Signed)
Patient notified and verbalized understanding of Dr Darnelle Bos recommendations. He has follow up with PCP in a few months and will discuss with her at that time.   Patient is very pleased with the outcomes and plan of care. He is excited to start traveling with his wife.

## 2020-05-26 NOTE — Progress Notes (Signed)
Subjective:   Andrew [name] is a 74 y.o. male who presents for Medicare Annual/Subsequent preventive examination.  I connected with Ouida Sills today by telephone and verified that I am speaking with the correct person using two identifiers. Location patient: home Location provider: work Persons participating in the virtual visit: patient, provider.   I discussed the limitations, risks, security and privacy concerns of performing an evaluation and management service by telephone and the availability of in person appointments. I also discussed with the patient that there may be a patient responsible charge related to this service. The patient expressed understanding and verbally consented to this telephonic visit.    Interactive audio and video telecommunications were attempted between this provider and patient, however failed, due to patient having technical difficulties OR patient did not have access to video capability.  We continued and completed visit with audio only.   Review of Systems    N/A  Cardiac Risk Factors include: advanced age (>71men, >2 women);male gender;dyslipidemia;hypertension     Objective:    Today's Vitals   05/27/20 1056  BP: 124/67  Pulse: 60  Weight: 158 lb (71.7 kg)   Body mass index is 21.43 kg/m.  Advanced Directives 05/27/2020 05/23/2019 02/21/2018 01/03/2018 12/14/2015  Does Patient Have a Medical Advance Directive? Yes Yes No Yes Yes  Type of Paramedic of [place];Living will [place];Living will - [place];Living will Living will  Copy of [place] in Chart? Yes - validated most recent copy scanned in chart (See row information) Yes - validated most recent copy scanned in chart (See row information) - Yes -    Current Medications (verified) Outpatient Encounter Medications as of 05/27/2020  Medication Sig  . cholecalciferol (VITAMIN D) 1000 UNITS tablet Take  1,000 Units by mouth daily.  . Multiple Vitamin (MULTI-VITAMIN DAILY) TABS Take 1 tablet by mouth daily.   . propranolol ER (INDERAL LA) 80 MG 24 hr capsule Take 1 capsule (80 mg total) by mouth daily.  . saw palmetto 160 MG capsule Take 160 mg by mouth every other day.   . Turmeric 450 MG CAPS Take 450 mg by mouth daily.   . metoprolol tartrate (LOPRESSOR) 100 MG tablet Take 1 tablet (100 mg total) by mouth once for 1 dose. Take 2 hours prior to CT.   No facility-administered encounter medications on file as of 05/27/2020.    Allergies (verified) Patient has no known allergies.   History: Past Medical History:  Diagnosis Date  . Atypical chest pain    a. Rest pain only; b. 08/2016 ETT: Ex time 46mins, no ST/T changes ->nl study.  . Headache    H/O MIGRAINES  . History of anxiety   . Hypertension   . NSVT (nonsustained ventricular tachycardia) ([place])    a. 03/2017 Zio Monitor: 2 episodes of NSVT - up to 7 beats, max HR 207.  Marland Kitchen Palpitations    a. 03/2017 14 day Zio Monitor: PACs, PVCs, brief PSVT and NSVT-->managed w/ beta blocker.  . Precancerous lesion   . PSVT (paroxysmal supraventricular tachycardia) ([place])    a. 03/2017  Zio monitor: 7 episodes of PSVT - up to 6 beats, max rate 193.  . Valvular heart disease    a. 06/2017 Echo: EF 60-65%. No rwma. Mild MR. Mild to mod TR. PASP 14mmHg.   Past Surgical History:  Procedure Laterality Date  . EYE SURGERY Right    Done as a child  .  INGUINAL HERNIA REPAIR Right 03/20/2018   Large Ultrapro mesh.  Surgeon: Robert Bellow, MD;  Location: [facility] ORS;  Service: General;  Laterality: Right;  Marland Kitchen VASECTOMY     30 yrs ago?   Family History  Problem Relation Age of Onset  . Heart attack Father    Social History   Socioeconomic History  . Marital status: Married    Spouse name: Not on file  . Number of children: 0  . Years of education: Not on file  . Highest education level: Some college, no degree  Occupational History  .  Occupation: Retired  Tobacco Use  . Smoking status: Never Smoker  . Smokeless tobacco: Never Used  Vaping Use  . Vaping Use: Never used  Substance and Sexual Activity  . Alcohol use: Yes    Alcohol/week: 4.0 standard drinks    Types: 1 Glasses of wine, 1 Shots of liquor, 2 Standard drinks or equivalent per week    Comment: 1 GLASS WINE WEEKLY/ MARGARITA OCC  . Drug use: No  . Sexual activity: Not on file  Other Topics Concern  . Not on file  Social History Narrative  . Not on file   Social Determinants of Health   Financial Resource Strain: Low Risk   . Difficulty of Paying Living Expenses: Not hard at all  Food Insecurity: No Food Insecurity  . Worried About Charity fundraiser in the Last Year: Never true  . Ran Out of Food in the Last Year: Never true  Transportation Needs: No Transportation Needs  . Lack of Transportation (Medical): No  . Lack of Transportation (Non-Medical): No  Physical Activity: Insufficiently Active  . Days of Exercise per Week: 1 day  . Minutes of Exercise per Session: 40 min  Stress: No Stress Concern Present  . Feeling of Stress : Only a little  Social Connections: Moderately Integrated  . Frequency of Communication with Friends and Family: More than three times a week  . Frequency of Social Gatherings with Friends and Family: More than three times a week  . Attends Religious Services: Never  . Active Member of Clubs or Organizations: Yes  . Attends Archivist Meetings: Never  . Marital Status: Married    Tobacco Counseling Counseling given: Not Answered   Clinical Intake:  Pre-visit preparation completed: Yes  Pain : No/denies pain     Nutritional Risks: None Diabetes: No  How often do you need to have someone help you when you read instructions, pamphlets, or other written materials from your doctor or pharmacy?: 1 - Never  Diabetic? No  Interpreter Needed?: No  Information entered by :: [facility],  LPN   Activities of Daily Living In your present state of health, do you have any difficulty performing the following activities: 05/27/2020  Hearing? N  Vision? N  Difficulty concentrating or making decisions? N  Walking or climbing stairs? N  Dressing or bathing? N  Doing errands, shopping? N  Preparing Food and eating ? N  Using the Toilet? N  In the past six months, have you accidently leaked urine? N  Do you have problems with loss of bowel control? N  Managing your Medications? N  Managing your Finances? N  Housekeeping or managing your Housekeeping? N  Some recent data might be hidden    Patient Care Team: Paulene Floor as PCP - General (Physician Assistant) End, Harrell Gave, MD as PCP - Cardiology (Cardiology) Theora Gianotti, NP as Nurse Practitioner (Nurse  Practitioner) Brendolyn Patty, MD as Consulting Physician (Dermatology)  Indicate any recent Medical Services you may have received from other than [facility] providers in the past year (date may be approximate).     Assessment:   This is a routine wellness examination for [place].  Hearing/Vision screen No exam data present  Dietary issues and exercise activities discussed: Current Exercise Habits: Home exercise routine, Type of exercise: walking, Time (Minutes): 45, Frequency (Times/Week): 1, Weekly Exercise (Minutes/Week): 45, Intensity: Mild, Exercise limited by: None identified  Goals    . Exercise 3x per week (30 min per time)     Recommend increasing exercise to 3 days a week for at least 30 minutes.       Depression Screen PHQ 2/9 Scores 05/27/2020 05/23/2019 01/03/2018  PHQ - 2 Score 0 0 0    Fall Risk Fall Risk  05/27/2020 05/23/2019 05/14/2019 01/03/2018  Falls in the past year? 0 0 0 No  Comment - - Emmi Telephone Survey: data to providers prior to load -  Number falls in past yr: 0 - - -  Injury with Fall? 0 - - -    Any stairs in or around the home? Yes  If so, are there any without  handrails? No  Home free of loose throw rugs in walkways, pet beds, electrical cords, etc? Yes  Adequate lighting in your home to reduce risk of falls? Yes   ASSISTIVE DEVICES UTILIZED TO PREVENT FALLS:  Life alert? No  Use of a cane, walker or w/c? No  Grab bars in the bathroom? No  Shower chair or bench in shower? No  Elevated toilet seat or a handicapped toilet? Yes    Cognitive Function: Declined today.     6CIT Screen 01/03/2018  What Year? 0 points  What month? 0 points  What time? 0 points  Count back from 20 0 points  Months in reverse 2 points  Repeat phrase 0 points  Total Score 2    Immunizations Immunization History  Administered Date(s) Administered  . Fluad Quad(high Dose 65+) 07/24/2019  . Hepatitis A, Adult 08/30/2017, 04/11/2018  . Influenza, High Dose Seasonal PF 08/09/2017, 07/24/2018  . PFIZER SARS-COV-2 Vaccination 11/23/2019, 12/18/2019  . Pneumococcal Conjugate-13 01/10/2018  . Pneumococcal Polysaccharide-23 07/24/2019  . Tdap 08/09/2017  . Typhoid Inactivated 08/30/2017  . Zoster Recombinat (Shingrix) 02/07/2019, 06/06/2019    TDAP status: Up to date Flu Vaccine status: Up to date Pneumococcal vaccine status: Up to date Covid-19 vaccine status: Completed vaccines  Qualifies for Shingles Vaccine? Yes   Zostavax completed No   Shingrix Completed?: Yes  Screening Tests Health Maintenance  Topic Date Due  . INFLUENZA VACCINE  05/11/2020  . Fecal DNA (Cologuard)  05/28/2020  . TETANUS/TDAP  08/10/2027  . COVID-19 Vaccine  Completed  . Hepatitis C Screening  Completed  . PNA vac Low Risk Adult  Completed    Health Maintenance  Health Maintenance Due  Topic Date Due  . INFLUENZA VACCINE  05/11/2020    Colorectal cancer screening: Cologuard completed 05/28/17. Repeat every 3 years  Lung Cancer Screening: (Low Dose CT Chest recommended if Age 21-80 years, 30 pack-year currently smoking OR have quit w/in 15years.) does not qualify.     Additional Screening:  Hepatitis C Screening: Up to date  Vision Screening: Recommended annual ophthalmology exams for early detection of glaucoma and other disorders of the eye. Is the patient up to date with their annual eye exam?  Yes  Who is the provider  or what is the name of the office in which the patient attends annual eye exams? Dr Margarite Gouge in [place] If pt is not established with a provider, would they like to be referred to a provider to establish care? No .   Dental Screening: Recommended annual dental exams for proper oral hygiene  Community Resource Referral / Chronic Care Management: CRR required this visit?  No   CCM required this visit?  No      Plan:     I have personally reviewed and noted the following in the patient's chart:   . Medical and social history . Use of alcohol, tobacco or illicit drugs  . Current medications and supplements . Functional ability and status . Nutritional status . Physical activity . Advanced directives . List of other physicians . Hospitalizations, surgeries, and ER visits in previous 12 months . Vitals . Screenings to include cognitive, depression, and falls . Referrals and appointments  In addition, I have reviewed and discussed with patient certain preventive protocols, quality metrics, and best practice recommendations. A written personalized care plan for preventive services as well as general preventive health recommendations were provided to patient.     Reubin Bushnell [place], [place]   [id number]   Nurse Notes: None.

## 2020-05-27 ENCOUNTER — Ambulatory Visit (INDEPENDENT_AMBULATORY_CARE_PROVIDER_SITE_OTHER): Payer: Medicare Other

## 2020-05-27 ENCOUNTER — Other Ambulatory Visit: Payer: Self-pay

## 2020-05-27 VITALS — BP 124/67 | HR 60 | Wt 158.0 lb

## 2020-05-27 DIAGNOSIS — Z0000 Encounter for general adult medical examination without abnormal findings: Secondary | ICD-10-CM

## 2020-05-27 DIAGNOSIS — Z1211 Encounter for screening for malignant neoplasm of colon: Secondary | ICD-10-CM | POA: Diagnosis not present

## 2020-05-27 NOTE — Patient Instructions (Signed)
Andrew [name] , Thank you for taking time to come for your Medicare Wellness Visit. I appreciate your ongoing commitment to your health goals. Please review the following plan we discussed and let me know if I can assist you in the future.   Screening recommendations/referrals: Colonoscopy: Cologuard order expires tomorrow. Order placed today. Recommended yearly ophthalmology/optometry visit for glaucoma screening and checkup Recommended yearly dental visit for hygiene and checkup  Vaccinations: Influenza vaccine: Due fall 2021 Pneumococcal vaccine: Completed series Tdap vaccine: Up to date, due 07/2027 Shingles vaccine: Completed series    Advanced directives: Currently on file.  Conditions/risks identified: Recommend increasing exercise to 3 days a week for at least 30 minutes.   Next appointment: 06/18/20 @ 10:00 AM with Carles Collet. Declined scheduling an AWV for 2022 at this time.  Preventive Care 44 Years and Older, Male Preventive care refers to lifestyle choices and visits with your health care provider that can promote health and wellness. What does preventive care include?  A yearly physical exam. This is also called an annual well check.  Dental exams once or twice a year.  Routine eye exams. Ask your health care provider how often you should have your eyes checked.  Personal lifestyle choices, including:  Daily care of your teeth and gums.  Regular physical activity.  Eating a healthy diet.  Avoiding tobacco and drug use.  Limiting alcohol use.  Practicing safe sex.  Taking low doses of aspirin every day.  Taking vitamin and mineral supplements as recommended by your health care provider. What happens during an annual well check? The services and screenings done by your health care provider during your annual well check will depend on your age, overall health, lifestyle risk factors, and family history of disease. Counseling  Your health care provider may  ask you questions about your:  Alcohol use.  Tobacco use.  Drug use.  Emotional well-being.  Home and relationship well-being.  Sexual activity.  Eating habits.  History of falls.  Memory and ability to understand (cognition).  Work and work Statistician. Screening  You may have the following tests or measurements:  Height, weight, and BMI.  Blood pressure.  Lipid and cholesterol levels. These may be checked every 5 years, or more frequently if you are over 5 years old.  Skin check.  Lung cancer screening. You may have this screening every year starting at age 32 if you have a 30-pack-year history of smoking and currently smoke or have quit within the past 15 years.  Fecal occult blood test (FOBT) of the stool. You may have this test every year starting at age 31.  Flexible sigmoidoscopy or colonoscopy. You may have a sigmoidoscopy every 5 years or a colonoscopy every 10 years starting at age 20.  Prostate cancer screening. Recommendations will vary depending on your family history and other risks.  Hepatitis C blood test.  Hepatitis B blood test.  Sexually transmitted disease (STD) testing.  Diabetes screening. This is done by checking your blood sugar (glucose) after you have not eaten for a while (fasting). You may have this done every 1-3 years.  Abdominal aortic aneurysm (AAA) screening. You may need this if you are a current or former smoker.  Osteoporosis. You may be screened starting at age 56 if you are at high risk. Talk with your health care provider about your test results, treatment options, and if necessary, the need for more tests. Vaccines  Your health care provider may recommend certain vaccines, such as:  Influenza vaccine. This is recommended every year.  Tetanus, diphtheria, and acellular pertussis (Tdap, Td) vaccine. You may need a Td booster every 10 years.  Zoster vaccine. You may need this after age 55.  Pneumococcal 13-valent  conjugate (PCV13) vaccine. One dose is recommended after age 73.  Pneumococcal polysaccharide (PPSV23) vaccine. One dose is recommended after age 11. Talk to your health care provider about which screenings and vaccines you need and how often you need them. This information is not intended to replace advice given to you by your health care provider. Make sure you discuss any questions you have with your health care provider. Document Released: 10/24/2015 Document Revised: 06/16/2016 Document Reviewed: 07/29/2015 Elsevier Interactive Patient Education  2017 [place] Prevention in the Home Falls can cause injuries. They can happen to people of all ages. There are many things you can do to make your home safe and to help prevent falls. What can I do on the outside of my home?  Regularly fix the edges of walkways and driveways and fix any cracks.  Remove anything that might make you trip as you walk through a door, such as a raised step or threshold.  Trim any bushes or trees on the path to your home.  Use bright outdoor lighting.  Clear any walking paths of anything that might make someone trip, such as rocks or tools.  Regularly check to see if handrails are loose or broken. Make sure that both sides of any steps have handrails.  Any raised decks and porches should have guardrails on the edges.  Have any leaves, snow, or ice cleared regularly.  Use sand or salt on walking paths during winter.  Clean up any spills in your garage right away. This includes oil or grease spills. What can I do in the bathroom?  Use night lights.  Install grab bars by the toilet and in the tub and shower. Do not use towel bars as grab bars.  Use non-skid mats or decals in the tub or shower.  If you need to sit down in the shower, use a plastic, non-slip stool.  Keep the floor dry. Clean up any water that spills on the floor as soon as it happens.  Remove soap buildup in the tub or  shower regularly.  Attach bath mats securely with double-sided non-slip rug tape.  Do not have throw rugs and other things on the floor that can make you trip. What can I do in the bedroom?  Use night lights.  Make sure that you have a light by your bed that is easy to reach.  Do not use any sheets or blankets that are too big for your bed. They should not hang down onto the floor.  Have a firm chair that has side arms. You can use this for support while you get dressed.  Do not have throw rugs and other things on the floor that can make you trip. What can I do in the kitchen?  Clean up any spills right away.  Avoid walking on wet floors.  Keep items that you use a lot in easy-to-reach places.  If you need to reach something above you, use a strong step stool that has a grab bar.  Keep electrical cords out of the way.  Do not use floor polish or wax that makes floors slippery. If you must use wax, use non-skid floor wax.  Do not have throw rugs and other things on the floor that  can make you trip. What can I do with my stairs?  Do not leave any items on the stairs.  Make sure that there are handrails on both sides of the stairs and use them. Fix handrails that are broken or loose. Make sure that handrails are as long as the stairways.  Check any carpeting to make sure that it is firmly attached to the stairs. Fix any carpet that is loose or worn.  Avoid having throw rugs at the top or bottom of the stairs. If you do have throw rugs, attach them to the floor with carpet tape.  Make sure that you have a light switch at the top of the stairs and the bottom of the stairs. If you do not have them, ask someone to add them for you. What else can I do to help prevent falls?  Wear shoes that:  Do not have high heels.  Have rubber bottoms.  Are comfortable and fit you well.  Are closed at the toe. Do not wear sandals.  If you use a stepladder:  Make sure that it is fully  opened. Do not climb a closed stepladder.  Make sure that both sides of the stepladder are locked into place.  Ask someone to hold it for you, if possible.  Clearly mark and make sure that you can see:  Any grab bars or handrails.  First and last steps.  Where the edge of each step is.  Use tools that help you move around (mobility aids) if they are needed. These include:  Canes.  Walkers.  Scooters.  Crutches.  Turn on the lights when you go into a dark area. Replace any light bulbs as soon as they burn out.  Set up your furniture so you have a clear path. Avoid moving your furniture around.  If any of your floors are uneven, fix them.  If there are any pets around you, be aware of where they are.  Review your medicines with your doctor. Some medicines can make you feel dizzy. This can increase your chance of falling. Ask your doctor what other things that you can do to help prevent falls. This information is not intended to replace advice given to you by your health care provider. Make sure you discuss any questions you have with your health care provider. Document Released: 07/24/2009 Document Revised: 03/04/2016 Document Reviewed: 11/01/2014 Elsevier Interactive Patient Education  2017 [employer].

## 2020-06-04 DIAGNOSIS — Z1211 Encounter for screening for malignant neoplasm of colon: Secondary | ICD-10-CM | POA: Diagnosis not present

## 2020-06-12 LAB — COLOGUARD
COLOGUARD: NEGATIVE
Cologuard: NEGATIVE

## 2020-06-17 ENCOUNTER — Telehealth: Payer: Self-pay

## 2020-06-17 NOTE — Telephone Encounter (Signed)
Copied from [place] [id number]. Topic: Appointment Scheduling - Scheduling Inquiry for Clinic >> Jun 17, 2020 11:09 AM Erick Blinks wrote: Pt wants to have lab work for cholesterol and A1C + general state of health   Best contact: [phone]

## 2020-06-18 ENCOUNTER — Other Ambulatory Visit: Payer: Self-pay

## 2020-06-18 ENCOUNTER — Ambulatory Visit (INDEPENDENT_AMBULATORY_CARE_PROVIDER_SITE_OTHER): Payer: Medicare Other | Admitting: Physician Assistant

## 2020-06-18 ENCOUNTER — Encounter: Payer: Self-pay | Admitting: Physician Assistant

## 2020-06-18 VITALS — BP 126/86 | HR 68 | Temp 98.5°F | Wt 160.6 lb

## 2020-06-18 DIAGNOSIS — I471 Supraventricular tachycardia, unspecified: Secondary | ICD-10-CM

## 2020-06-18 DIAGNOSIS — R739 Hyperglycemia, unspecified: Secondary | ICD-10-CM

## 2020-06-18 DIAGNOSIS — E7800 Pure hypercholesterolemia, unspecified: Secondary | ICD-10-CM

## 2020-06-18 DIAGNOSIS — I10 Essential (primary) hypertension: Secondary | ICD-10-CM

## 2020-06-18 NOTE — Telephone Encounter (Signed)
Patient is coming in today. We will check labs below.

## 2020-06-18 NOTE — Progress Notes (Signed)
Established patient visit   Patient: Andrew [name]   DOB: 1946-08-13   74 y.o. Male  MRN: [id number] Visit Date: 06/18/2020  Today's healthcare provider: Trinna Post, PA-C   Chief Complaint  Patient presents with  . Hypertension  . Hypothyroidism  I,Cutter Passey M Kamyia Thomason,acting as a scribe for [employer], PA-C.,have documented all relevant documentation on the behalf of Trinna Post, PA-C,as directed by  Trinna Post, PA-C while in the presence of Trinna Post, PA-C.  Subjective    HPI  AWE was w/ [facility] on 78/17/2021  Hypertension, follow-up  BP Readings from Last 3 Encounters:  06/18/20 126/86  05/27/20 124/67  02/14/20 109/74   Wt Readings from Last 3 Encounters:  06/18/20 160 lb 9.6 oz (72.8 kg)  05/27/20 158 lb (71.7 kg)  01/23/20 165 lb (74.8 kg)     He was last seen for hypertension 11 months ago.  BP at that visit was 123/77. Management since that visit includes continue current medication. He reports good compliance with treatment. He is not having side effects.  He is exercising. He is adherent to low salt diet.   Outside blood pressures are arranging in the 120's/60's-70's.  He does not smoke.  Use of agents associated with hypertension: none.   --------------------------------------------------------------------------------------------------- Lipid/Cholesterol, follow-up  Last Lipid Panel: Lab Results  Component Value Date   CHOL 186 06/13/2019   LDLCALC 127 (H) 06/13/2019   HDL 48 06/13/2019   TRIG 60 06/13/2019    He was last seen for this 11 months ago.  Management since that visit includes none. He has consistently declined statin medication in the past.  He reports good compliance with treatment. He is not having side effects.   Symptoms: No appetite changes No foot ulcerations  No chest pain No chest pressure/discomfort  No dyspnea No orthopnea  No fatigue No lower extremity edema  No palpitations No  paroxysmal nocturnal dyspnea  No nausea No numbness or tingling of extremity  No polydipsia No polyuria  No speech difficulty No syncope   He is following a Regular, Low Sodium diet. Current exercise: walking and yard work  Last metabolic panel Lab Results  Component Value Date   GLUCOSE 111 (H) 02/05/2020   NA 137 02/05/2020   K 4.6 02/05/2020   BUN 19 02/05/2020   CREATININE 0.99 02/05/2020   GFRNONAA >60 02/05/2020   GFRAA >60 02/05/2020   CALCIUM 9.0 02/05/2020   AST 15 06/13/2019   ALT 15 06/13/2019   The 10-year ASCVD risk score Mikey Bussing DC Jr., et al., 2013) is: 22.8%  ---------------------------------------------------------------------------------------------------      Medications: Outpatient Medications Prior to Visit  Medication Sig  . cholecalciferol (VITAMIN D) 1000 UNITS tablet Take 1,000 Units by mouth daily.  . Multiple Vitamin (MULTI-VITAMIN DAILY) TABS Take 1 tablet by mouth daily.   . propranolol ER (INDERAL LA) 80 MG 24 hr capsule Take 1 capsule (80 mg total) by mouth daily.  . saw palmetto 160 MG capsule Take 160 mg by mouth every other day.   . Turmeric 450 MG CAPS Take 450 mg by mouth daily.   . metoprolol tartrate (LOPRESSOR) 100 MG tablet Take 1 tablet (100 mg total) by mouth once for 1 dose. Take 2 hours prior to CT.   No facility-administered medications prior to visit.    Review of Systems  Constitutional: Negative.   Respiratory: Negative.   Cardiovascular: Negative.   Hematological: Negative.  Objective    BP 126/86 (BP Location: Left Arm, Patient Position: Sitting, Cuff Size: Normal)   Pulse 68   Temp 98.5 F (36.9 C) (Oral)   Wt 160 lb 9.6 oz (72.8 kg)   SpO2 99%   BMI 21.78 kg/m    Physical Exam Constitutional:      Appearance: Normal appearance.  Cardiovascular:     Rate and Rhythm: Normal rate and regular rhythm.     Pulses: Normal pulses.     Heart sounds: Normal heart sounds.  Pulmonary:     Effort:  Pulmonary effort is normal.     Breath sounds: Normal breath sounds.  Skin:    General: Skin is warm and dry.  Neurological:     General: No focal deficit present.     Mental Status: He is alert and oriented to person, place, and time.  Psychiatric:        Mood and Affect: Mood normal.        Behavior: Behavior normal.       No results found for any visits on 06/18/20.  Assessment & Plan    1. Pure hypercholesterolemia  Followed by cardiology. Wants to get flu shot with [facility].   - TSH - Lipid panel - Comprehensive metabolic panel - CBC with Differential/Platelet - HgB A1c  2. Essential hypertension  Followed by cardiology. Continue current medications.   - TSH - Lipid panel - Comprehensive metabolic panel - CBC with Differential/Platelet - HgB A1c  3. PSVT (paroxysmal supraventricular tachycardia) ([place])  Followed by cardiology.   4. Hyperglycemia  - TSH - Lipid panel - Comprehensive metabolic panel - CBC with Differential/Platelet - HgB A1c   Return if symptoms worsen or fail to improve.      ITrinna Post, PA-C, have reviewed all documentation for this visit. The documentation on 06/18/20 for the exam, diagnosis, procedures, and orders are all accurate and complete.  The entirety of the information documented in the History of Present Illness, Review of Systems and Physical Exam were personally obtained by me. Portions of this information were initially documented by [facility] and reviewed by me for thoroughness and accuracy.   I spent 30 minutes dedicated to the care of this patient on the date of this encounter to include pre-visit review of records, face-to-face time with the patient discussing HLD, HTN, preventive care, and post visit ordering of testing.  Paulene Floor  [facility] [phone] (phone) [phone] (fax)  [place]

## 2020-06-19 LAB — COMPREHENSIVE METABOLIC PANEL
ALT: 12 IU/L (ref 0–44)
AST: 19 IU/L (ref 0–40)
Albumin/Globulin Ratio: 1.6 (ref 1.2–2.2)
Albumin: 4.4 g/dL (ref 3.7–4.7)
Alkaline Phosphatase: 61 IU/L (ref 48–121)
BUN/Creatinine Ratio: 12 (ref 10–24)
BUN: 12 mg/dL (ref 8–27)
Bilirubin Total: 0.7 mg/dL (ref 0.0–1.2)
CO2: 22 mmol/L (ref 20–29)
Calcium: 9.1 mg/dL (ref 8.6–10.2)
Chloride: 103 mmol/L (ref 96–106)
Creatinine, Ser: 1 mg/dL (ref 0.76–1.27)
GFR calc Af Amer: 87 mL/min/{1.73_m2} (ref >59)
GFR calc non Af Amer: 75 mL/min/{1.73_m2} (ref >59)
Globulin, Total: 2.8 g/dL (ref 1.5–4.5)
Glucose: 103 mg/dL — ABNORMAL HIGH (ref 65–99)
Potassium: 4.6 mmol/L (ref 3.5–5.2)
Sodium: 139 mmol/L (ref 134–144)
Total Protein: 7.2 g/dL (ref 6.0–8.5)

## 2020-06-19 LAB — TSH: TSH: 0.243 u[IU]/mL — ABNORMAL LOW (ref 0.450–4.500)

## 2020-06-19 LAB — LIPID PANEL
Chol/HDL Ratio: 3.9 ratio (ref 0.0–5.0)
Cholesterol, Total: 197 mg/dL (ref 100–199)
HDL: 50 mg/dL (ref >39)
LDL Chol Calc (NIH): 137 mg/dL — ABNORMAL HIGH (ref 0–99)
Triglycerides: 52 mg/dL (ref 0–149)
VLDL Cholesterol Cal: 10 mg/dL (ref 5–40)

## 2020-06-19 LAB — CBC WITH DIFFERENTIAL/PLATELET
Basophils Absolute: 0 10*3/uL (ref 0.0–0.2)
Basos: 1 %
EOS (ABSOLUTE): 0.1 10*3/uL (ref 0.0–0.4)
Eos: 3 %
Hematocrit: 41.2 % (ref 37.5–51.0)
Hemoglobin: 13.6 g/dL (ref 13.0–17.7)
Immature Grans (Abs): 0 10*3/uL (ref 0.0–0.1)
Immature Granulocytes: 0 %
Lymphocytes Absolute: 1 10*3/uL (ref 0.7–3.1)
Lymphs: 22 %
MCH: 31.2 pg (ref 26.6–33.0)
MCHC: 33 g/dL (ref 31.5–35.7)
MCV: 95 fL (ref 79–97)
Monocytes Absolute: 0.4 10*3/uL (ref 0.1–0.9)
Monocytes: 9 %
Neutrophils Absolute: 3.1 10*3/uL (ref 1.4–7.0)
Neutrophils: 65 %
Platelets: 133 10*3/uL — ABNORMAL LOW (ref 150–450)
RBC: 4.36 x10E6/uL (ref 4.14–5.80)
RDW: 11.8 % (ref 11.6–15.4)
WBC: 4.7 10*3/uL (ref 3.4–10.8)

## 2020-06-19 LAB — HEMOGLOBIN A1C
Est. average glucose Bld gHb Est-mCnc: 105 mg/dL
Hgb A1c MFr Bld: 5.3 % (ref 4.8–5.6)

## 2020-06-24 ENCOUNTER — Other Ambulatory Visit: Payer: Self-pay

## 2020-06-24 ENCOUNTER — Ambulatory Visit (INDEPENDENT_AMBULATORY_CARE_PROVIDER_SITE_OTHER): Payer: Medicare Other | Admitting: Dermatology

## 2020-06-24 DIAGNOSIS — D1800 Hemangioma unspecified site: Secondary | ICD-10-CM | POA: Diagnosis not present

## 2020-06-24 DIAGNOSIS — L814 Other melanin hyperpigmentation: Secondary | ICD-10-CM

## 2020-06-24 DIAGNOSIS — L578 Other skin changes due to chronic exposure to nonionizing radiation: Secondary | ICD-10-CM | POA: Diagnosis not present

## 2020-06-24 DIAGNOSIS — I781 Nevus, non-neoplastic: Secondary | ICD-10-CM | POA: Diagnosis not present

## 2020-06-24 DIAGNOSIS — Z1283 Encounter for screening for malignant neoplasm of skin: Secondary | ICD-10-CM

## 2020-06-24 DIAGNOSIS — L821 Other seborrheic keratosis: Secondary | ICD-10-CM | POA: Diagnosis not present

## 2020-06-24 DIAGNOSIS — Z85828 Personal history of other malignant neoplasm of skin: Secondary | ICD-10-CM | POA: Diagnosis not present

## 2020-06-24 DIAGNOSIS — L820 Inflamed seborrheic keratosis: Secondary | ICD-10-CM

## 2020-06-24 DIAGNOSIS — D229 Melanocytic nevi, unspecified: Secondary | ICD-10-CM | POA: Diagnosis not present

## 2020-06-24 DIAGNOSIS — D2261 Melanocytic nevi of right upper limb, including shoulder: Secondary | ICD-10-CM | POA: Diagnosis not present

## 2020-06-24 DIAGNOSIS — L570 Actinic keratosis: Secondary | ICD-10-CM | POA: Diagnosis not present

## 2020-06-24 NOTE — Progress Notes (Signed)
Follow-Up Visit   Subjective  Andrew [name] is a 74 y.o. male who presents for the following: Annual Exam (Patient with a history of BCC and AK's. He has had PDT to scalp with good results. ). It has been a couple years since his last treatment.  He does have some tender spots at scalp. There are some spots at back of legs his wife would like looked at, she is present with him today. Also itchy spots on R side.  The following portions of the chart were reviewed this encounter and updated as appropriate:      Review of Systems:  No other skin or systemic complaints except as noted in HPI or Assessment and Plan.  Objective  Well appearing patient in no apparent distress; mood and affect are within normal limits.  A focused examination was performed including right leg, trunk, arms, scalp and face. Relevant physical exam findings are noted in the Assessment and Plan.  Objective  L inf vertex x 4, crown x 6 (10): Erythematous thin papules/macules with gritty scale.   Objective  Nose: Dilated blood vessels nasal ala  Objective  Left Nasal Ala: 2.74mm scaly flesh flat papule  Right Flank: Erythematous keratotic or waxy stuck-on papule  Objective  Right 2nd MCP: 41mm light pink flat papule   Assessment & Plan  AK (actinic keratosis) (10) L inf vertex x 4, crown x 6  Discussed PDT in the fall to scalp x 2 treatments, 1 mo apart. Pt will consider.  Destruction of lesion - L inf vertex x 4, crown x 6  Destruction method: cryotherapy   Informed consent: discussed and consent obtained   Lesion destroyed using liquid nitrogen: Yes   Region frozen until ice ball extended beyond lesion: Yes   Outcome: patient tolerated procedure well with no complications   Post-procedure details: wound care instructions given    Telangiectasia Nose  Benign, observe.    Inflamed seborrheic keratosis (2) Right Flank; Left Nasal Ala  Vs fibrous papule at left nasal ala  Destruction of  lesion - Left Nasal Ala, Right Flank  Destruction method: cryotherapy   Informed consent: discussed and consent obtained   Lesion destroyed using liquid nitrogen: Yes   Region frozen until ice ball extended beyond lesion: Yes   Outcome: patient tolerated procedure well with no complications   Post-procedure details: wound care instructions given    Nevus Right 2nd MCP  Benign-appearing.  Observation.  Call clinic for new or changing moles.  Recommend daily use of broad spectrum spf 30+ sunscreen to sun-exposed areas.     Lentigines - Scattered tan macules at arms - Discussed due to sun exposure - Benign, observe - Call for any changes  Seborrheic Keratoses - Stuck-on, waxy, tan-brown papules and plaques at back, right popliteal - Discussed benign etiology and prognosis. - Observe - Call for any changes  Melanocytic Nevi - Tan-brown and/or pink-flesh-colored symmetric macules and papules - Benign appearing on exam today - Observation - Call clinic for new or changing moles - Recommend daily use of broad spectrum spf 30+ sunscreen to sun-exposed areas.   Hemangiomas - Red papules - Discussed benign nature - Observe - Call for any changes  Actinic Damage - diffuse scaly erythematous macules with underlying dyspigmentation - Recommend daily broad spectrum sunscreen SPF 30+ to sun-exposed areas, reapply every 2 hours as needed.  - Call for new or changing lesions.  Skin cancer screening performed today.  History of Basal Cell Carcinoma of  the Skin - No evidence of recurrence today at R upper ear third helix, right temple - Recommend regular full body skin exams - Recommend daily broad spectrum sunscreen SPF 30+ to sun-exposed areas, reapply every 2 hours as needed.  - Call if any new or changing lesions are noted between office visits  Return in about 6 months (around 12/22/2020) for AK follow up.  Graciella Belton, RMA, am acting as scribe for Brendolyn Patty, MD  .  Documentation: I have reviewed the above documentation for accuracy and completeness, and I agree with the above.  Brendolyn Patty MD

## 2020-06-24 NOTE — Patient Instructions (Addendum)

## 2020-07-16 DIAGNOSIS — Z23 Encounter for immunization: Secondary | ICD-10-CM | POA: Diagnosis not present

## 2020-07-23 ENCOUNTER — Encounter: Payer: Self-pay | Admitting: Internal Medicine

## 2020-07-23 ENCOUNTER — Other Ambulatory Visit: Payer: Self-pay

## 2020-07-23 ENCOUNTER — Ambulatory Visit (INDEPENDENT_AMBULATORY_CARE_PROVIDER_SITE_OTHER): Payer: Medicare Other | Admitting: Internal Medicine

## 2020-07-23 VITALS — BP 140/82 | HR 68 | Ht 72.0 in | Wt 159.0 lb

## 2020-07-23 DIAGNOSIS — E7800 Pure hypercholesterolemia, unspecified: Secondary | ICD-10-CM | POA: Diagnosis not present

## 2020-07-23 DIAGNOSIS — R030 Elevated blood-pressure reading, without diagnosis of hypertension: Secondary | ICD-10-CM | POA: Diagnosis not present

## 2020-07-23 DIAGNOSIS — I700 Atherosclerosis of aorta: Secondary | ICD-10-CM

## 2020-07-23 DIAGNOSIS — R079 Chest pain, unspecified: Secondary | ICD-10-CM

## 2020-07-23 DIAGNOSIS — R002 Palpitations: Secondary | ICD-10-CM

## 2020-07-23 NOTE — Patient Instructions (Signed)
Medication Instructions:  Your physician recommends that you continue on your current medications as directed. Please refer to the Current Medication list given to you today.  *If you need a refill on your cardiac medications before your next appointment, please call your pharmacy*  Follow-Up: At [facility], you and your health needs are our priority.  As part of our continuing mission to provide you with exceptional heart care, we have created designated Provider Care Teams.  These Care Teams include your primary Cardiologist (physician) and Advanced Practice Providers (APPs -  Physician Assistants and Nurse Practitioners) who all work together to provide you with the care you need, when you need it.  We recommend signing up for the patient portal called "MyChart".  Sign up information is provided on this After Visit Summary.  MyChart is used to connect with patients for Virtual Visits (Telemedicine).  Patients are able to view lab/test results, encounter notes, upcoming appointments, etc.  Non-urgent messages can be sent to your provider as well.   To learn more about what you can do with MyChart, go to [email or web address].    Your next appointment:   12 month(s)  The format for your next appointment:   In Person  Provider:   You may see Christopher End, MD or one of the following Advanced Practice Providers on your designated Care Team:    Christopher Berge, NP  Ryan Dunn, PA-C  Jacquelyn Visser, PA-C  Cadence Furth, PA-C  

## 2020-07-23 NOTE — Progress Notes (Signed)
Follow-up Outpatient Visit Date: 07/23/2020  Primary Care Provider: Trinna Post, PA-C [place] [id number]  Chief Complaint: Follow-up atypical chest pain and palpitations  HPI:  Andrew [name] is a 74 y.o. male with history of atypical chest pain, palpitations with monitor showing PSVT and NSVT, and anxiety, who presents for follow-up of palpitations and chest pain.  I last saw Andrew [name] in April, at which time he complained of more frequent chest pain and palpitations.  Symptoms were not exertional.  We agreed to switch from metoprolol succinate to propranolol, given his concern for essential tremor.  Cardiac CTA showed normal coronaries without stenosis or calcification.  Aortic atherosclerosis and hiatal hernia were noted.  Today, Andrew [name] reports that he has been feeling quite well, better than at prior visits.  He attributes this to switching from metoprolol to propranolol.  He still notices occasional uneasy feelings in his chest but no frank chest pain or palpitations.  His essential tremor has also improved.  He denies shortness of breath, palpitations, lightheadedness, and edema.  --------------------------------------------------------------------------------------------------  Past Medical History:  Diagnosis Date  . Atypical chest pain    a. Rest pain only; b. 08/2016 ETT: Ex time 59mins, no ST/T changes ->nl study.  . Basal cell carcinoma 08/11/2015   Right temple. Superficial and nodular patterns. Excised 09/02/2015, margins free.  . Basal cell carcinoma 05/16/2018   Right upper third ear helix. Nodular pattern.  . Headache    H/O MIGRAINES  . History of anxiety   . Hypertension   . NSVT (nonsustained ventricular tachycardia) ([place])    a. 03/2017 Zio Monitor: 2 episodes of NSVT - up to 7 beats, max HR 207.  Marland Kitchen Palpitations    a. 03/2017 14 day Zio Monitor: PACs, PVCs, brief PSVT and NSVT-->managed w/ beta blocker.  . Precancerous  lesion   . PSVT (paroxysmal supraventricular tachycardia) ([place])    a. 03/2017  Zio monitor: 7 episodes of PSVT - up to 6 beats, max rate 193.  . Valvular heart disease    a. 06/2017 Echo: EF 60-65%. No rwma. Mild MR. Mild to mod TR. PASP 26mmHg.   Past Surgical History:  Procedure Laterality Date  . EYE SURGERY Right    Done as a child  . INGUINAL HERNIA REPAIR Right 03/20/2018   Large Ultrapro mesh.  Surgeon: Robert Bellow, MD;  Location: [facility] ORS;  Service: General;  Laterality: Right;  Marland Kitchen VASECTOMY     30 yrs ago?    Current Meds  Medication Sig  . cholecalciferol (VITAMIN D) 1000 UNITS tablet Take 1,000 Units by mouth daily.  . Multiple Vitamin (MULTI-VITAMIN DAILY) TABS Take 1 tablet by mouth daily.   . propranolol ER (INDERAL LA) 80 MG 24 hr capsule Take 1 capsule (80 mg total) by mouth daily.  . saw palmetto 160 MG capsule Take 160 mg by mouth every other day.   . Turmeric 450 MG CAPS Take 450 mg by mouth daily.     Allergies: Patient has no known allergies.  Social History   Tobacco Use  . Smoking status: Never Smoker  . Smokeless tobacco: Never Used  Vaping Use  . Vaping Use: Never used  Substance Use Topics  . Alcohol use: Yes    Alcohol/week: 4.0 standard drinks    Types: 1 Glasses of wine, 1 Shots of liquor, 2 Standard drinks or equivalent per week    Comment: 1 GLASS WINE WEEKLY/ [facility] OCC  . Drug  use: No    Family History  Problem Relation Age of Onset  . Heart attack Father     Review of Systems: A 12-system review of systems was performed and was negative except as noted in the HPI.  --------------------------------------------------------------------------------------------------  Physical Exam: BP 140/82 (BP Location: Left Arm, Patient Position: Sitting, Cuff Size: Normal)   Pulse 68   Ht 6' (1.829 m)   Wt 159 lb (72.1 kg)   SpO2 92%   BMI 21.56 kg/m   General: NAD. Neck: No JVD or HJR. Lungs: Clear to auscultation bilaterally  without wheezes or crackles. Heart: Regular rate and rhythm without murmurs, rubs, or gallops. Abdomen: Soft, nontender, nondistended. Extremities: No lower extremity edema.  EKG: Normal sinus rhythm without significant abnormality.  No change compared with prior tracing from 01/23/2020.  Lab Results  Component Value Date   WBC 4.7 06/18/2020   HGB 13.6 06/18/2020   HCT 41.2 06/18/2020   MCV 95 06/18/2020   PLT 133 (L) 06/18/2020    Lab Results  Component Value Date   NA 139 06/18/2020   K 4.6 06/18/2020   CL 103 06/18/2020   CO2 22 06/18/2020   BUN 12 06/18/2020   CREATININE 1.00 06/18/2020   GLUCOSE 103 (H) 06/18/2020   ALT 12 06/18/2020    Lab Results  Component Value Date   CHOL 197 06/18/2020   HDL 50 06/18/2020   LDLCALC 137 (H) 06/18/2020   TRIG 52 06/18/2020   CHOLHDL 3.9 06/18/2020    --------------------------------------------------------------------------------------------------  ASSESSMENT AND PLAN: Chest pain and palpitations: Symptoms have almost completely resolved, though Andrew [name] continues to have intermittent uneasy feelings in his chest.  Cardiac CTA since our last visit was reassuring without any coronary artery disease or calcification.  He is feeling better with transition to propranolol, which we plan to continue.  I encouraged Andrew [name] to continue to work on lifestyle modifications for primary prevention.  Aortic atherosclerosis and hyperlipidemia: Incidental note was made of minimal atherosclerotic calcification involving the aortic root and ascending aorta.  Most recent lipid panel earlier this year is notable for an LDL of 137.  We have discussed lifestyle modifications and lipid-lowering therapy and have agreed to work on diet and exercise.  Elevated blood pressure: Blood pressure reading today is mildly elevated, though measurements at previous office visits have been better.  I encouraged Andrew [name] to limit his sodium intake.  We  will defer medication changes today.  Follow-up: Return to clinic in 1 year.  Follow-up:  [name], MD 07/23/2020 10:00 AM

## 2020-10-08 ENCOUNTER — Other Ambulatory Visit: Payer: Self-pay | Admitting: Internal Medicine

## 2020-12-22 ENCOUNTER — Ambulatory Visit: Payer: Medicare Other | Admitting: Dermatology

## 2020-12-30 ENCOUNTER — Encounter: Payer: Self-pay | Admitting: Dermatology

## 2020-12-30 ENCOUNTER — Other Ambulatory Visit: Payer: Self-pay

## 2020-12-30 ENCOUNTER — Ambulatory Visit (INDEPENDENT_AMBULATORY_CARE_PROVIDER_SITE_OTHER): Payer: Medicare Other | Admitting: Dermatology

## 2020-12-30 DIAGNOSIS — L570 Actinic keratosis: Secondary | ICD-10-CM | POA: Diagnosis not present

## 2020-12-30 DIAGNOSIS — Z85828 Personal history of other malignant neoplasm of skin: Secondary | ICD-10-CM

## 2020-12-30 DIAGNOSIS — L905 Scar conditions and fibrosis of skin: Secondary | ICD-10-CM | POA: Diagnosis not present

## 2020-12-30 DIAGNOSIS — L578 Other skin changes due to chronic exposure to nonionizing radiation: Secondary | ICD-10-CM | POA: Diagnosis not present

## 2020-12-30 DIAGNOSIS — L821 Other seborrheic keratosis: Secondary | ICD-10-CM

## 2020-12-30 NOTE — Progress Notes (Signed)
Follow-Up Visit   Subjective  Andrew [name] is a 75 y.o. male who presents for the following: Follow-up (Aks of the scalp. Patient has a few new spots on his scalp today.).  He has had PDT treatment of scalp in the past with good result.  He has h/o BCC R ear helix 2019 EDC, R temple 2016 excised  The following portions of the chart were reviewed this encounter and updated as appropriate:       Review of Systems:  No other skin or systemic complaints except as noted in HPI or Assessment and Plan.  Objective  Well appearing patient in no apparent distress; mood and affect are within normal limits.  A focused examination was performed including face, scalp. Relevant physical exam findings are noted in the Assessment and Plan.  Objective  Crown Scalp x 6, R sideburn x 1 (7): Erythematous thin papules/macules with gritty scale.   Objective  Occipital Scalp: 7.0 x 5.54mm smooth pink flat papule   Assessment & Plan  AK (actinic keratosis) (7) Crown Scalp x 6, R sideburn x 1  Prior to procedure, discussed risks of blister formation, small wound, skin dyspigmentation, or rare scar following cryotherapy.    Destruction of lesion - Crown Scalp x 6, R sideburn x 1  Destruction method: cryotherapy   Informed consent: discussed and consent obtained   Lesion destroyed using liquid nitrogen: Yes   Region frozen until ice ball extended beyond lesion: Yes   Outcome: patient tolerated procedure well with no complications   Post-procedure details: wound care instructions given    Scar Occipital Scalp   Scar from Biopsy proven pigmented SK (12/11/19)  Benign, observe.      Actinic Damage - Severe, chronic, not at goal, secondary to cumulative UV radiation exposure over time - diffuse scaly erythematous macules and papules with underlying dyspigmentation - Discussed Prescription "Field Treatment" for Severe, Chronic Confluent Actinic Changes with Pre-Cancerous Actinic Keratoses Field  treatment involves treatment of an entire area of skin that has confluent Actinic Changes (Sun/ Ultraviolet light damage) and PreCancerous Actinic Keratoses by method of PhotoDynamic Therapy (PDT) and/or prescription Topical Chemotherapy agents such as 5-fluorouracil, 5-fluorouracil/calcipotriene, and/or imiquimod.  The purpose is to decrease the number of clinically evident and subclinical PreCancerous lesions to prevent progression to development of skin cancer by chemically destroying early precancer changes that may or may not be visible.  It has been shown to reduce the risk of developing skin cancer in the treated area. As a result of treatment, redness, scaling, crusting, and open sores may occur during treatment course. One or more than one of these methods may be used and may have to be used several times to control, suppress and eliminate the PreCancerous changes. Discussed treatment course, expected reaction, and possible side effects. - Recommend daily broad spectrum sunscreen SPF 30+ to sun-exposed areas, reapply every 2 hours as needed.  - Staying in the shade or wearing long sleeves, sun glasses (UVA+UVB protection) and wide brim hats (4-inch brim around the entire circumference of the hat) are also recommended. - Call for new or changing lesions. -Consider repeat PDT to scalp in the fall.  History of Basal Cell Carcinoma of the Skin - No evidence of recurrence today R ear, R temple - Recommend regular full body skin exams - Recommend daily broad spectrum sunscreen SPF 30+ to sun-exposed areas, reapply every 2 hours as needed.  - Call if any new or changing lesions are noted between office visits  Seborrheic Keratoses - Stuck-on, waxy, tan-brown papules and plaques  - Discussed benign etiology and prognosis. - Observe - Call for any changes  Return in about 6 months (around 07/02/2021) for UBSE.   IJamesetta Orleans, CMA, am acting as scribe for Brendolyn Patty, MD .  Documentation: I  have reviewed the above documentation for accuracy and completeness, and I agree with the above.  Brendolyn Patty MD

## 2020-12-30 NOTE — Patient Instructions (Addendum)

## 2021-03-04 DIAGNOSIS — Z23 Encounter for immunization: Secondary | ICD-10-CM | POA: Diagnosis not present

## 2021-03-13 DIAGNOSIS — H43813 Vitreous degeneration, bilateral: Secondary | ICD-10-CM | POA: Diagnosis not present

## 2021-03-13 DIAGNOSIS — H2513 Age-related nuclear cataract, bilateral: Secondary | ICD-10-CM | POA: Diagnosis not present

## 2021-06-26 ENCOUNTER — Other Ambulatory Visit: Payer: Self-pay | Admitting: Internal Medicine

## 2021-06-29 ENCOUNTER — Other Ambulatory Visit: Payer: Self-pay

## 2021-06-29 ENCOUNTER — Ambulatory Visit (INDEPENDENT_AMBULATORY_CARE_PROVIDER_SITE_OTHER): Payer: Medicare Other | Admitting: Dermatology

## 2021-06-29 DIAGNOSIS — Z872 Personal history of diseases of the skin and subcutaneous tissue: Secondary | ICD-10-CM | POA: Diagnosis not present

## 2021-06-29 DIAGNOSIS — D229 Melanocytic nevi, unspecified: Secondary | ICD-10-CM | POA: Diagnosis not present

## 2021-06-29 DIAGNOSIS — L821 Other seborrheic keratosis: Secondary | ICD-10-CM | POA: Diagnosis not present

## 2021-06-29 DIAGNOSIS — L570 Actinic keratosis: Secondary | ICD-10-CM | POA: Diagnosis not present

## 2021-06-29 DIAGNOSIS — L814 Other melanin hyperpigmentation: Secondary | ICD-10-CM | POA: Diagnosis not present

## 2021-06-29 DIAGNOSIS — Z85828 Personal history of other malignant neoplasm of skin: Secondary | ICD-10-CM

## 2021-06-29 DIAGNOSIS — L578 Other skin changes due to chronic exposure to nonionizing radiation: Secondary | ICD-10-CM | POA: Diagnosis not present

## 2021-06-29 DIAGNOSIS — Z1283 Encounter for screening for malignant neoplasm of skin: Secondary | ICD-10-CM

## 2021-06-29 DIAGNOSIS — D1800 Hemangioma unspecified site: Secondary | ICD-10-CM

## 2021-06-29 NOTE — Progress Notes (Signed)
   Follow-Up Visit   Subjective  Andrew [name] is a 75 y.o. male who presents for the following: Upper body skin exam (Hx of BCCs, hx of AKs).  Patient accompanied by wife who contributes to history.  The following portions of the chart were reviewed this encounter and updated as appropriate:       Review of Systems:  No other skin or systemic complaints except as noted in HPI or Assessment and Plan.  Objective  Well appearing patient in no apparent distress; mood and affect are within normal limits.  All skin waist up examined.  L mid jaw x 1, R glabella x 1, mid crown scalp x 3 (5) Pink scaly macules    Assessment & Plan   Lentigines - Scattered tan macules - Due to sun exposure - Benign-appearing, observe - Recommend daily broad spectrum sunscreen SPF 30+ to sun-exposed areas, reapply every 2 hours as needed. - Call for any changes - back, arms  Seborrheic Keratoses - Stuck-on, waxy, tan-brown papules and/or plaques  - Benign-appearing - Discussed benign etiology and prognosis. - Observe - Call for any changes - back, abdomen, chest  Melanocytic Nevi - Tan-brown and/or pink-flesh-colored symmetric macules and papules - Benign appearing on exam today - Observation - Call clinic for new or changing moles - Recommend daily use of broad spectrum spf 30+ sunscreen to sun-exposed areas.   Hemangiomas - Red papules - Discussed benign nature - Observe - Call for any changes - trunk  Actinic Damage - Chronic condition, secondary to cumulative UV/sun exposure - diffuse scaly erythematous macules with underlying dyspigmentation - Recommend daily broad spectrum sunscreen SPF 30+ to sun-exposed areas, reapply every 2 hours as needed.  - Staying in the shade or wearing long sleeves, sun glasses (UVA+UVB protection) and wide brim hats (4-inch brim around the entire circumference of the hat) are also recommended for sun protection.  - Call for new or changing  lesions.  Skin cancer screening performed today.  History of Basal Cell Carcinoma of the Skin - No evidence of recurrence today- R upper ear helix, R temple - Recommend regular full body skin exams - Recommend daily broad spectrum sunscreen SPF 30+ to sun-exposed areas, reapply every 2 hours as needed.  - Call if any new or changing lesions are noted between office visits   AK (actinic keratosis) (5) L mid jaw x 1, R glabella x 1, mid crown scalp x 3    Destruction of lesion - L mid jaw x 1, R glabella x 1, mid crown scalp x 3  Destruction method: cryotherapy   Informed consent: discussed and consent obtained   Lesion destroyed using liquid nitrogen: Yes   Region frozen until ice ball extended beyond lesion: Yes   Outcome: patient tolerated procedure well with no complications   Post-procedure details: wound care instructions given   Additional details:  Prior to procedure, discussed risks of blister formation, small wound, skin dyspigmentation, or rare scar following cryotherapy. Recommend Vaseline ointment to treated areas while healing.    Return in about 1 year (around 06/29/2022) for UBSE, Hx of BCC, Hx of AKs.  I, Othelia Pulling, RMA, am acting as scribe for Brendolyn Patty, MD .  Documentation: I have reviewed the above documentation for accuracy and completeness, and I agree with the above.  Brendolyn Patty MD

## 2021-06-29 NOTE — Patient Instructions (Signed)

## 2021-07-08 DIAGNOSIS — Z23 Encounter for immunization: Secondary | ICD-10-CM | POA: Diagnosis not present

## 2021-07-16 ENCOUNTER — Ambulatory Visit: Payer: Self-pay | Admitting: *Deleted

## 2021-07-16 NOTE — Telephone Encounter (Signed)
Right knee pain on/off for 2 months. Sharp pain with positional stepping like going down steps. Feels slightly thick but does not look swollen, no heat or redness. Occasional ache with getting up from squatting. Taking occasional ibuprofen as a preventative measure. Offered appointment-declined at this time. He will call back after hiking event this weekend if he has pain. Care advice per protocol.    Reason for Disposition  Knee pain is a chronic symptom (recurrent or ongoing AND present > 4 weeks)  Answer Assessment - Initial Assessment Questions 1. LOCATION and RADIATION: "Where is the pain located?"      Right knee. Top of knee cap  2. QUALITY: "What does the pain feel like?"  (e.g., sharp, dull, aching, burning)     Sharp pain and a pop 3. SEVERITY: "How bad is the pain?" "What does it keep you from doing?"   (Scale 1-10; or mild, moderate, severe)   -  MILD (1-3): doesn't interfere with normal activities    -  MODERATE (4-7): interferes with normal activities (e.g., work or school) or awakens from sleep, limping    -  SEVERE (8-10): excruciating pain, unable to do any normal activities, unable to walk     Hurts with up and down stairs. 4. ONSET: "When did the pain start?" "Does it come and go, or is it there all the time?"     2 months ago. Now it comes and goes with up and down stairs. 5. RECURRENT: "Have you had this pain before?" If Yes, ask: "When, and what happened then?"    no 6. SETTING: "Has there been any recent work, exercise or other activity that involved that part of the body?"      no 7. AGGRAVATING FACTORS: "What makes the knee pain worse?" (e.g., walking, climbing stairs, running)     Climbing stairs. Now with walking it feels thick and achy. 8. ASSOCIATED SYMPTOMS: "Is there any swelling or redness of the knee?"     Feels thick but looks same as other knee 9. OTHER SYMPTOMS: "Do you have any other symptoms?" (e.g., chest pain, difficulty breathing, fever, calf pain)      Possibly slight warmth 10. PREGNANCY: "Is there any chance you are pregnant?" "When was your last menstrual period?"       na  Protocols used: Knee Pain-A-AH

## 2021-07-23 ENCOUNTER — Ambulatory Visit: Payer: Medicare Other | Admitting: Internal Medicine

## 2021-07-24 ENCOUNTER — Other Ambulatory Visit: Payer: Self-pay

## 2021-07-24 ENCOUNTER — Ambulatory Visit (INDEPENDENT_AMBULATORY_CARE_PROVIDER_SITE_OTHER): Payer: Medicare Other | Admitting: Internal Medicine

## 2021-07-24 ENCOUNTER — Encounter: Payer: Self-pay | Admitting: Internal Medicine

## 2021-07-24 VITALS — BP 130/88 | HR 96 | Ht 72.0 in | Wt 154.0 lb

## 2021-07-24 DIAGNOSIS — R079 Chest pain, unspecified: Secondary | ICD-10-CM

## 2021-07-24 DIAGNOSIS — R002 Palpitations: Secondary | ICD-10-CM

## 2021-07-24 DIAGNOSIS — E7800 Pure hypercholesterolemia, unspecified: Secondary | ICD-10-CM

## 2021-07-24 DIAGNOSIS — I700 Atherosclerosis of aorta: Secondary | ICD-10-CM

## 2021-07-24 DIAGNOSIS — I10 Essential (primary) hypertension: Secondary | ICD-10-CM

## 2021-07-24 NOTE — Progress Notes (Signed)
Follow-up Outpatient Visit Date: 07/24/2021  Primary Care Provider: Trinna Post, PA-C (Inactive) No address on file  Chief Complaint: Follow-up chest pain and palpitations  HPI:  Mr. Andrew [name] is a 75 y.o. male with history of atypical chest pain, palpitations with monitor showing PSVT and NSVT, and anxiety, who presents for follow-up of palpitations and chest pain.  I last saw him a year ago, at which time Mr. [name] was feeling quite well.  In particular, he felt like switching from metoprolol to propranolol had helped him the most.  We discussed statin therapy in the setting of mild aortic atherosclerosis and elevated LDL but agreed to defer pharmacotherapy in favor of lifestyle modifications.  Today, Mr. [name] reports that he has been feeling pretty well.  He reports only a single episode of his "odd feeling" in the chest since our last visit a year ago.  He notes minimal palpitations as well as no significant shortness of breath, lightheadedness, or edema.  Blood pressures are typically well controlled.  He is tolerating his current medications well including propranolol, which has helped his tremor.  He is scheduled for his annual wellness visit next month and anticipates having routine labs performed at that time, including a lipid panel to follow-up on his hyperlipidemia noted last year.  --------------------------------------------------------------------------------------------------  Past Medical History:  Diagnosis Date   Atypical chest pain    a. Rest pain only; b. 08/2016 ETT: Ex time 17mins, no ST/T changes ->nl study.   Basal cell carcinoma 08/11/2015   Right temple. Superficial and nodular patterns. Excised 09/02/2015, margins free.   Basal cell carcinoma 05/16/2018   Right upper third ear helix. Nodular pattern. txd with EDC   Headache    H/O MIGRAINES   History of anxiety    Hypertension    NSVT (nonsustained ventricular tachycardia)    a. 03/2017 Zio  Monitor: 2 episodes of NSVT - up to 7 beats, max HR 207.   Palpitations    a. 03/2017 14 day Zio Monitor: PACs, PVCs, brief PSVT and NSVT-->managed w/ beta blocker.   Precancerous lesion    PSVT (paroxysmal supraventricular tachycardia) ([place])    a. 03/2017  Zio monitor: 7 episodes of PSVT - up to 6 beats, max rate 193.   Valvular heart disease    a. 06/2017 Echo: EF 60-65%. No rwma. Mild MR. Mild to mod TR. PASP 68mmHg.   Past Surgical History:  Procedure Laterality Date   EYE SURGERY Right    Done as a child   INGUINAL HERNIA REPAIR Right 03/20/2018   Large Ultrapro mesh.  Surgeon: Robert Bellow, MD;  Location: [facility] ORS;  Service: General;  Laterality: Right;   VASECTOMY     30 yrs ago?    Current Meds  Medication Sig   cholecalciferol (VITAMIN D) 1000 UNITS tablet Take 1,000 Units by mouth daily.   MAGNESIUM PO Take 1 tablet by mouth daily.   Multiple Vitamin (MULTI-VITAMIN DAILY) TABS Take 1 tablet by mouth daily.    propranolol ER (INDERAL LA) 80 MG 24 hr capsule TAKE 1 CAPSULE BY MOUTH EVERY DAY   saw palmetto 160 MG capsule Take 160 mg by mouth every other day.    Turmeric 450 MG CAPS Take 450 mg by mouth daily.     Allergies: Patient has no known allergies.  Social History   Tobacco Use   Smoking status: Never   Smokeless tobacco: Never  Vaping Use   Vaping Use: Never used  Substance Use  Topics   Alcohol use: Yes    Alcohol/week: 4.0 standard drinks    Types: 1 Glasses of wine, 1 Shots of liquor, 2 Standard drinks or equivalent per week    Comment: 1 GLASS WINE WEEKLY/ [facility]   Drug use: No    Family History  Problem Relation Age of Onset   Heart attack Father     Review of Systems: A 12-system review of systems was performed and was negative except as noted in the HPI.  --------------------------------------------------------------------------------------------------  Physical Exam: BP 130/88 (BP Location: Left Arm, Patient Position: Sitting,  Cuff Size: Normal)   Pulse 96   Ht 6' (1.829 m)   Wt 154 lb (69.9 kg)   SpO2 97%   BMI 20.89 kg/m   General:  NAD. Neck: No JVD or HJR. Lungs: Clear to auscultation bilaterally without wheezes or crackles. Heart: Regular rate and rhythm without murmurs, rubs, or gallops. Abdomen: Soft, nontender, nondistended. Extremities: No lower extremity edema.  EKG: Normal sinus rhythm without significant abnormality or change from prior tracing on 07/23/2020.  Lab Results  Component Value Date   WBC 4.7 06/18/2020   HGB 13.6 06/18/2020   HCT 41.2 06/18/2020   MCV 95 06/18/2020   PLT 133 (L) 06/18/2020    Lab Results  Component Value Date   NA 139 06/18/2020   K 4.6 06/18/2020   CL 103 06/18/2020   CO2 22 06/18/2020   BUN 12 06/18/2020   CREATININE 1.00 06/18/2020   GLUCOSE 103 (H) 06/18/2020   ALT 12 06/18/2020    Lab Results  Component Value Date   CHOL 197 06/18/2020   HDL 50 06/18/2020   LDLCALC 137 (H) 06/18/2020   TRIG 52 06/18/2020   CHOLHDL 3.9 06/18/2020    --------------------------------------------------------------------------------------------------  ASSESSMENT AND PLAN: Chest pain and palpitations: Mr. [name] reports minimal symptoms since last year.  Prior coronary CTA showed no significant CAD or coronary artery calcification.  We have agreed to defer additional work-up at this time.  Aortic atherosclerosis and hyperlipidemia: Coronary CTA last year made note of minimal aortic atherosclerosis in the setting of mild hyperlipidemia.  Mr. [name] wished to defer statin therapy in favor of lifestyle modifications last year.  His lipids have not been rechecked since then.  He is due for his wellness visit next month, at which time a lipid panel should be checked.  If his LDL has not improved, statin therapy should be readdressed.  Follow-up: Return to clinic in 1 year.  [name], MD 07/25/2021 2:11 PM

## 2021-07-24 NOTE — Patient Instructions (Signed)
Medication Instructions:  - Your physician recommends that you continue on your current medications as directed. Please refer to the Current Medication list given to you today.   *If you need a refill on your cardiac medications before your next appointment, please call your pharmacy*   Lab Work: - none ordered  If you have labs (blood work) drawn today and your tests are completely normal, you will receive your results only by: [place] (if you have MyChart) OR A paper copy in the mail If you have any lab test that is abnormal or we need to change your treatment, we will call you to review the results.   Testing/Procedures: - none ordered   Follow-Up: At [facility], you and your health needs are our priority.  As part of our continuing mission to provide you with exceptional heart care, we have created designated Provider Care Teams.  These Care Teams include your primary Cardiologist (physician) and Advanced Practice Providers (APPs -  Physician Assistants and Nurse Practitioners) who all work together to provide you with the care you need, when you need it.  We recommend signing up for the patient portal called "MyChart".  Sign up information is provided on this After Visit Summary.  MyChart is used to connect with patients for Virtual Visits (Telemedicine).  Patients are able to view lab/test results, encounter notes, upcoming appointments, etc.  Non-urgent messages can be sent to your provider as well.   To learn more about what you can do with MyChart, go to [email or web address].    Your next appointment:   1 year(s)  The format for your next appointment:   In Person  Provider:   You may see [name], MD or one of the following Advanced Practice Providers on your designated Care Team:   Murray Hodgkins, NP Christell Faith, PA-C Marrianne Mood, PA-C Cadence Kathlen Mody, [place]   Other Instructions N/a

## 2021-07-25 ENCOUNTER — Encounter: Payer: Self-pay | Admitting: Internal Medicine

## 2021-09-24 ENCOUNTER — Other Ambulatory Visit: Payer: Self-pay | Admitting: Internal Medicine

## 2021-12-24 ENCOUNTER — Other Ambulatory Visit: Payer: Self-pay | Admitting: Internal Medicine

## 2021-12-29 IMAGING — CT CT HEART MORP W/ CTA COR W/ SCORE W/ CA W/CM &/OR W/O CM
1 of 17 series · 3 of 20 positions shown, 4 images · non-contrast
Comparison: Chest radiograph 12/14/2015

Addendum:
CLINICAL DATA: Chestpain and shortness of breath

EXAM:
Cardiac/Coronary  CTA
TECHNIQUE: The patient was scanned on a Siemens Somatoform go.Top scanner.

[Series 43: multiphase cta coronary 0.60 · axial · 0.37mm/px · z∈[-1156,-1011]mm · 3 of 3993 slices shown, 4 images]
[im 1/3993  vessel]
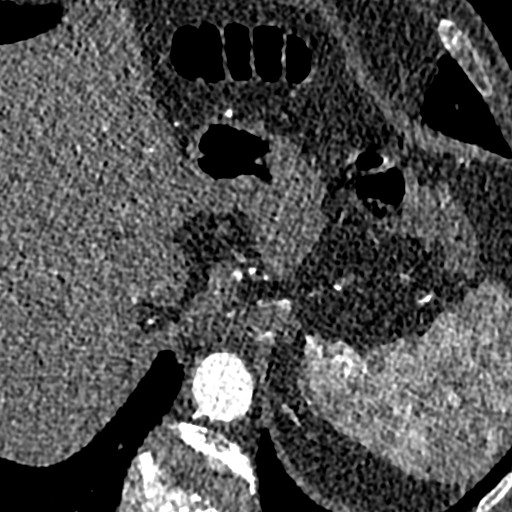
[im 1/3993  lung]
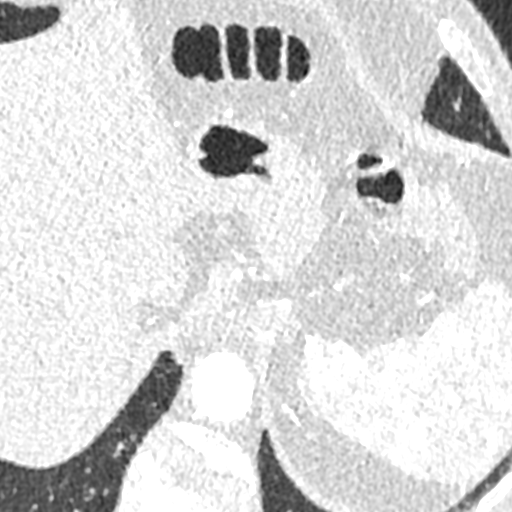
[im 1997/3993  vessel]
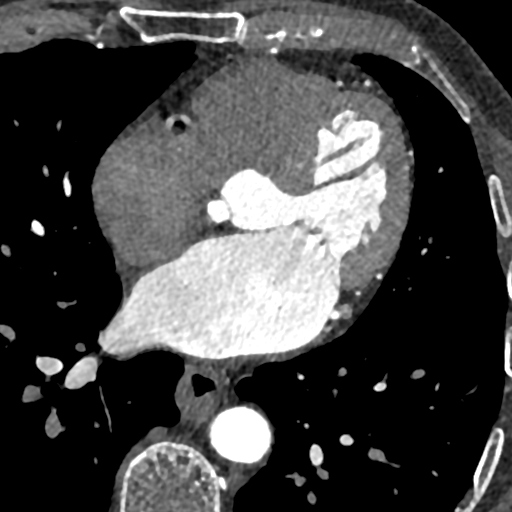
[im 3993/3993  vessel]
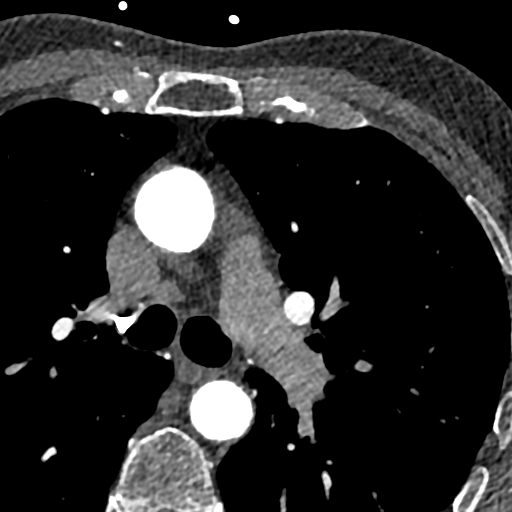

[3 of 20 positions shown; findings below may reference images not displayed]

FINDINGS: A retrospective scan was triggered in the descending thoracic aorta.
Axial non-contrast 3 mm slices were carried out through the heart.
The data set was analyzed on a dedicated work station and scored
using the Agatson method. Gantry rotation speed was 330 msecs and
collimation was .6 mm. 100mg of metoprolol and 0.8 mg of sl NTG was
given. The 3D data set was reconstructed in 5% intervals of the
40-95 % of the R-R cycle. Diastolic phases were analyzed on a
dedicated work station using MPR, MIP and VRT modes. The patient
received 75 cc of contrast.

Aorta: Normal size. Minimal ascending aortic wall calcifications. No
dissection.

Aortic Valve:  Trileaflet.  No calcifications.

Coronary Arteries:  Normal coronary origin.  Left dominance.

RCA is a large nondominant artery giving rise to an RV branch. There
is no plaque.

Left main is a large artery that gives rise to LAD and LCX arteries.

LAD is a large vessel that has no plaque.

LCX is a dominant artery that gives rise to one large OM1 branch and
the PDA. There is no plaque.

Other findings:

Normal pulmonary vein drainage into the left atrium.

Normal left atrial appendage without a thrombus.

Normal size of the pulmonary artery.
IMPRESSION: 1. Coronary calcium score of 0. Patient is low risk for near term
coronary events

2. Normal coronary origin with left dominance.

3. No evidence of CAD.

4. CAD-RADS 0. Consider non-atherosclerotic causes of chest pain.

ADDENDUM:
OVER-READ INTERPRETATION  CT CHEST

The following report is an over-read performed by radiologist Dr.
does not include interpretation of cardiac or coronary anatomy or
pathology. The coronary CTA interpretation by the cardiologist is
attached.
FINDINGS: No pleural fluid.  Clear imaged lungs.

Aortic atherosclerosis. No central pulmonary embolism, on this
non-dedicated study.

No imaged thoracic adenopathy.  Moderate hiatal hernia.

High left hepatic lobe 2.2 cm cyst. Normal imaged portions of the
spleen.

No acute osseous abnormality.
IMPRESSION: 1.  No acute findings in the imaged extracardiac chest.
2.  Aortic Atherosclerosis ([id number]).
3. Moderate hiatal hernia.

*** End of Addendum ***
FINDINGS: A retrospective scan was triggered in the descending thoracic aorta.
Axial non-contrast 3 mm slices were carried out through the heart.
The data set was analyzed on a dedicated work station and scored
using the Agatson method. Gantry rotation speed was 330 msecs and
collimation was .6 mm. 100mg of metoprolol and 0.8 mg of sl NTG was
given. The 3D data set was reconstructed in 5% intervals of the
40-95 % of the R-R cycle. Diastolic phases were analyzed on a
dedicated work station using MPR, MIP and VRT modes. The patient
received 75 cc of contrast.

Aorta: Normal size. Minimal ascending aortic wall calcifications. No
dissection.

Aortic Valve:  Trileaflet.  No calcifications.

Coronary Arteries:  Normal coronary origin.  Left dominance.

RCA is a large nondominant artery giving rise to an RV branch. There
is no plaque.

Left main is a large artery that gives rise to LAD and LCX arteries.

LAD is a large vessel that has no plaque.

LCX is a dominant artery that gives rise to one large OM1 branch and
the PDA. There is no plaque.

Other findings:

Normal pulmonary vein drainage into the left atrium.

Normal left atrial appendage without a thrombus.

Normal size of the pulmonary artery.
IMPRESSION: 1. Coronary calcium score of 0. Patient is low risk for near term
coronary events

2. Normal coronary origin with left dominance.

3. No evidence of CAD.

4. CAD-RADS 0. Consider non-atherosclerotic causes of chest pain.

## 2022-04-02 DIAGNOSIS — H43813 Vitreous degeneration, bilateral: Secondary | ICD-10-CM | POA: Diagnosis not present

## 2022-04-02 DIAGNOSIS — H2513 Age-related nuclear cataract, bilateral: Secondary | ICD-10-CM | POA: Diagnosis not present

## 2022-04-14 ENCOUNTER — Telehealth: Payer: Self-pay

## 2022-04-14 NOTE — Telephone Encounter (Signed)
Copied from [place] [id number]. Topic: General - Other >> Apr 14, 2022 11:50 AM [place] B wrote: Reason for CRM: patient's wife called in to schedule AWV

## 2022-04-21 ENCOUNTER — Ambulatory Visit (INDEPENDENT_AMBULATORY_CARE_PROVIDER_SITE_OTHER): Payer: Medicare Other | Admitting: Physician Assistant

## 2022-04-21 ENCOUNTER — Encounter: Payer: Self-pay | Admitting: Physician Assistant

## 2022-04-21 VITALS — BP 122/68 | HR 80 | Ht 72.0 in | Wt 156.3 lb

## 2022-04-21 DIAGNOSIS — R739 Hyperglycemia, unspecified: Secondary | ICD-10-CM | POA: Diagnosis not present

## 2022-04-21 DIAGNOSIS — E782 Mixed hyperlipidemia: Secondary | ICD-10-CM

## 2022-04-21 DIAGNOSIS — R7989 Other specified abnormal findings of blood chemistry: Secondary | ICD-10-CM | POA: Diagnosis not present

## 2022-04-21 DIAGNOSIS — I700 Atherosclerosis of aorta: Secondary | ICD-10-CM

## 2022-04-21 DIAGNOSIS — S6991XA Unspecified injury of right wrist, hand and finger(s), initial encounter: Secondary | ICD-10-CM | POA: Diagnosis not present

## 2022-04-21 DIAGNOSIS — I10 Essential (primary) hypertension: Secondary | ICD-10-CM | POA: Diagnosis not present

## 2022-04-21 DIAGNOSIS — Z0000 Encounter for general adult medical examination without abnormal findings: Secondary | ICD-10-CM | POA: Diagnosis not present

## 2022-04-21 NOTE — Progress Notes (Addendum)
I,Sha'taria Tyson,acting as a Education administrator for [employer], PA-C.,have documented all relevant documentation on the behalf of Andrew Kirschner, PA-C,as directed by  Andrew Kirschner, PA-C while in the presence of Andrew Kirschner, PA-C.   Annual Wellness Visit     Patient: Andrew [name], Male    DOB: 14-Oct-1945, 76 y.o.   MRN: [id number] Visit Date: 04/21/2022  Today's Provider: Mikey Kirschner, PA-C   Cc. Cpe/awv  Subjective    Andrew [name] is a 76 y.o. male who presents today for his Annual Wellness Visit. He reports consuming a general diet. The patient does not participate in regular exercise at present. He generally feels well. He reports sleeping fairly well. He does have additional problems to discuss today.   Reports falling one step 6 months ago, landed on his hands. Reports feeling pain in his R little finger, lasted 1-2 days. Today no pain, no change in ROM or strength. Visually he states it looks as if it cannot bend as much.     Medications: Outpatient Medications Prior to Visit  Medication Sig   cholecalciferol (VITAMIN D) 1000 UNITS tablet Take 1,000 Units by mouth daily.   MAGNESIUM PO Take 1 tablet by mouth daily.   Multiple Vitamin (MULTI-VITAMIN DAILY) TABS Take 1 tablet by mouth daily.    propranolol ER (INDERAL LA) 80 MG 24 hr capsule TAKE 1 CAPSULE BY MOUTH EVERY DAY   saw palmetto 160 MG capsule Take 160 mg by mouth every other day.    Turmeric 450 MG CAPS Take 450 mg by mouth daily.    No facility-administered medications prior to visit.    No Known Allergies  Patient Care Team: Andrew Kirschner, PA-C as PCP - General (Physician Assistant) End, Harrell Gave, MD as PCP - Cardiology (Cardiology) Theora Gianotti, NP as Nurse Practitioner (Nurse Practitioner) Brendolyn Patty, MD as Consulting Physician (Dermatology)  Review of Systems  Constitutional:  Negative for fatigue and fever.  Respiratory:  Negative for cough and shortness of breath.    Cardiovascular:  Negative for chest pain, palpitations and leg swelling.  Genitourinary:  Positive for frequency.  Neurological:  Negative for dizziness and headaches.       Objective    Blood pressure 122/68, pulse 80, height 6' (1.829 m), weight 156 lb 4.8 oz (70.9 kg), SpO2 100 %.    Physical Exam Constitutional:      General: He is awake.     Appearance: He is well-developed.  HENT:     Head: Normocephalic.     Right Ear: Tympanic membrane, ear canal and external ear normal.     Left Ear: Tympanic membrane, ear canal and external ear normal.     Nose: Nose normal. No congestion or rhinorrhea.     Mouth/Throat:     Mouth: Mucous membranes are moist.     Pharynx: No oropharyngeal exudate or posterior oropharyngeal erythema.  Eyes:     Pupils: Pupils are equal, round, and reactive to light.  Cardiovascular:     Rate and Rhythm: Normal rate and regular rhythm.     Heart sounds: Normal heart sounds.  Pulmonary:     Effort: Pulmonary effort is normal.     Breath sounds: Normal breath sounds.  Abdominal:     General: There is no distension.     Palpations: Abdomen is soft.     Tenderness: There is no abdominal tenderness. There is no guarding.  Musculoskeletal:     Cervical back: Normal range of motion.  Right lower leg: No edema.     Left lower leg: No edema.  Lymphadenopathy:     Cervical: No cervical adenopathy.  Skin:    General: Skin is warm.  Neurological:     Mental Status: He is alert and oriented to person, place, and time.  Psychiatric:        Attention and Perception: Attention normal.        Mood and Affect: Mood normal.        Speech: Speech normal.        Behavior: Behavior normal. Behavior is cooperative.     Most recent functional status assessment:    04/21/2022   10:44 AM  In your present state of health, do you have any difficulty performing the following activities:  Hearing? 0  Vision? 0  Difficulty concentrating or making decisions? 0   Walking or climbing stairs? 0  Dressing or bathing? 0  Doing errands, shopping? 0   Most recent fall risk assessment:    04/21/2022   10:44 AM  Fall Risk   Falls in the past year? 1  Number falls in past yr: 0  Injury with Fall? 0  Risk for fall due to : No Fall Risks    Most recent depression screenings:    04/21/2022   10:44 AM 05/27/2020   11:05 AM  PHQ 2/9 Scores  PHQ - 2 Score 0 0  PHQ- 9 Score 0    Most recent cognitive screening:    01/03/2018   10:58 AM  6CIT Screen  What Year? 0 points  What month? 0 points  What time? 0 points  Count back from 20 0 points  Months in reverse 2 points  Repeat phrase 0 points  Total Score 2 points   Most recent Audit-C alcohol use screening    04/21/2022   10:44 AM  Alcohol Use Disorder Test (AUDIT)  1. How often do you have a drink containing alcohol? 2  2. How many drinks containing alcohol do you have on a typical day when you are drinking? 0  3. How often do you have six or more drinks on one occasion? 0  AUDIT-C Score 2   A score of 3 or more in women, and 4 or more in men indicates increased risk for alcohol abuse, EXCEPT if all of the points are from question 1   No results found for any visits on 04/21/22.  Assessment & Plan     Annual wellness visit done today including the all of the following: Reviewed patient's Family Medical History Reviewed and updated list of patient's medical providers Assessment of cognitive impairment was done Assessed patient's functional ability Established a written schedule for health screening [place] Completed and Reviewed  Exercise Activities and Dietary recommendations  Goals      Exercise 3x per week (30 min per time)     Recommend increasing exercise to 3 days a week for at least 30 minutes.         Immunization History  Administered Date(s) Administered   Fluad Quad(high Dose 65+) 07/24/2019   Hepatitis A, Adult 08/30/2017, 04/11/2018    Influenza, High Dose Seasonal PF 08/09/2017, 07/24/2018   PFIZER(Purple Top)SARS-COV-2 Vaccination 11/23/2019, 12/18/2019   Pneumococcal Conjugate-13 01/10/2018   Pneumococcal Polysaccharide-23 07/24/2019   Tdap 08/09/2017   Typhoid Inactivated 08/30/2017   Zoster Recombinat (Shingrix) 02/07/2019, 06/06/2019    Health Maintenance  Topic Date Due   COVID-19 Vaccine (3 - Pfizer risk series)  05/07/2022 (Originally 01/15/2020)   INFLUENZA VACCINE  05/11/2022   Fecal DNA (Cologuard)  06/13/2023   TETANUS/TDAP  08/10/2027   Pneumonia Vaccine 36+ Years old  Completed   Hepatitis C Screening  Completed   Zoster Vaccines- Shingrix  Completed   HPV VACCINES  Aged Out   COLONOSCOPY (Pts 45-57yr Insurance coverage will need to be confirmed)  Discontinued     Discussed health benefits of physical activity, and encouraged him to engage in regular exercise appropriate for his age and condition.    Problem List Items Addressed This Visit       Cardiovascular and Mediastinum   Essential hypertension    Well controlled, managed with propranolol 80 mg which he takes for abnormal heart rhythm hx as well      Relevant Orders   Comprehensive Metabolic Panel (CMET)   CBC with Differential/Platelet   Aortic atherosclerosis ([place]     Other   Pure hypercholesterolemia    Borderline previously. Will get fasting lipids. Aortic atherosclerosis was once documented on a chest ct which caused The 10-year ASCVD risk score (Arnett DK, et al., 2019) is: 24.7%  But Coronary calcium score of 0. 02/18/20  If lipids stable or improved, will not start a statin, I feel this is appropriate and mirrors the pt's choices.      Injury of finger of right hand    6 mo ago per pt No visible deficits.       Other Visit Diagnoses     Encounter for Medicare annual wellness exam    -  Primary   Hyperglycemia       Relevant Orders   Comprehensive Metabolic Panel (CMET)   CBC with Differential/Platelet   HgB A1c    Abnormal thyroid blood test       Relevant Orders   TSH + free T4        Return in about 1 year (around 04/22/2023) for AVW, CPE.     I, LMikey Kirschner PA-C have reviewed all documentation for this visit. The documentation on  04/21/2022 for the exam, diagnosis, procedures, and orders are all accurate and complete.  LMikey Kirschner PA-C [facility] [place] [address]Office: [phone]: [phone]

## 2022-04-21 NOTE — Assessment & Plan Note (Addendum)
6 mo ago per pt No visible deficits.

## 2022-04-21 NOTE — Assessment & Plan Note (Signed)
Well controlled, managed with propranolol 80 mg which he takes for abnormal heart rhythm hx as well

## 2022-04-21 NOTE — Assessment & Plan Note (Addendum)
Borderline previously. Will get fasting lipids. Aortic atherosclerosis was once documented on a chest ct which caused The 10-year ASCVD risk score (Arnett DK, et al., 2019) is: 24.7%  But Coronary calcium score of 0. 02/18/20  If lipids stable or improved, will not start a statin, I feel this is appropriate and mirrors the pt's choices.

## 2022-04-23 DIAGNOSIS — R7989 Other specified abnormal findings of blood chemistry: Secondary | ICD-10-CM | POA: Diagnosis not present

## 2022-04-23 DIAGNOSIS — I10 Essential (primary) hypertension: Secondary | ICD-10-CM | POA: Diagnosis not present

## 2022-04-23 DIAGNOSIS — E782 Mixed hyperlipidemia: Secondary | ICD-10-CM | POA: Diagnosis not present

## 2022-04-23 DIAGNOSIS — R739 Hyperglycemia, unspecified: Secondary | ICD-10-CM | POA: Diagnosis not present

## 2022-04-24 LAB — COMPREHENSIVE METABOLIC PANEL
ALT: 16 IU/L (ref 0–44)
AST: 17 IU/L (ref 0–40)
Albumin/Globulin Ratio: 1.8 (ref 1.2–2.2)
Albumin: 4.4 g/dL (ref 3.8–4.8)
Alkaline Phosphatase: 59 IU/L (ref 44–121)
BUN/Creatinine Ratio: 20 (ref 10–24)
BUN: 21 mg/dL (ref 8–27)
Bilirubin Total: 0.8 mg/dL (ref 0.0–1.2)
CO2: 24 mmol/L (ref 20–29)
Calcium: 9.4 mg/dL (ref 8.6–10.2)
Chloride: 102 mmol/L (ref 96–106)
Creatinine, Ser: 1.07 mg/dL (ref 0.76–1.27)
Globulin, Total: 2.4 g/dL (ref 1.5–4.5)
Glucose: 104 mg/dL — ABNORMAL HIGH (ref 70–99)
Potassium: 5.4 mmol/L — ABNORMAL HIGH (ref 3.5–5.2)
Sodium: 138 mmol/L (ref 134–144)
Total Protein: 6.8 g/dL (ref 6.0–8.5)
eGFR: 73 mL/min/{1.73_m2} (ref >59)

## 2022-04-24 LAB — HEMOGLOBIN A1C
Est. average glucose Bld gHb Est-mCnc: 108 mg/dL
Hgb A1c MFr Bld: 5.4 % (ref 4.8–5.6)

## 2022-04-24 LAB — LIPID PANEL
Chol/HDL Ratio: 3.5 ratio (ref 0.0–5.0)
Cholesterol, Total: 173 mg/dL (ref 100–199)
HDL: 49 mg/dL (ref >39)
LDL Chol Calc (NIH): 115 mg/dL — ABNORMAL HIGH (ref 0–99)
Triglycerides: 46 mg/dL (ref 0–149)
VLDL Cholesterol Cal: 9 mg/dL (ref 5–40)

## 2022-04-24 LAB — CBC WITH DIFFERENTIAL/PLATELET
Basophils Absolute: 0 10*3/uL (ref 0.0–0.2)
Basos: 1 %
EOS (ABSOLUTE): 0.2 10*3/uL (ref 0.0–0.4)
Eos: 4 %
Hematocrit: 38 % (ref 37.5–51.0)
Hemoglobin: 13.3 g/dL (ref 13.0–17.7)
Immature Grans (Abs): 0 10*3/uL (ref 0.0–0.1)
Immature Granulocytes: 0 %
Lymphocytes Absolute: 1.2 10*3/uL (ref 0.7–3.1)
Lymphs: 30 %
MCH: 32.4 pg (ref 26.6–33.0)
MCHC: 35 g/dL (ref 31.5–35.7)
MCV: 93 fL (ref 79–97)
Monocytes Absolute: 0.4 10*3/uL (ref 0.1–0.9)
Monocytes: 11 %
Neutrophils Absolute: 2.1 10*3/uL (ref 1.4–7.0)
Neutrophils: 54 %
Platelets: 129 10*3/uL — ABNORMAL LOW (ref 150–450)
RBC: 4.1 x10E6/uL — ABNORMAL LOW (ref 4.14–5.80)
RDW: 11.6 % (ref 11.6–15.4)
WBC: 3.9 10*3/uL (ref 3.4–10.8)

## 2022-04-24 LAB — TSH+FREE T4
Free T4: 1.55 ng/dL (ref 0.82–1.77)
TSH: 0.36 u[IU]/mL — ABNORMAL LOW (ref 0.450–4.500)

## 2022-06-02 ENCOUNTER — Ambulatory Visit: Payer: Self-pay | Admitting: *Deleted

## 2022-06-02 NOTE — Telephone Encounter (Signed)
Per agent: "Patients wife called in, states was dx  with covid 'Sunday, productive cough, dizzy and tired. He is asking for med, plaxovid sent to pharmacy, [id number] - [place], [place] - [address] [facility] [address] [facility]  Phone: [phone]  Fax: [phone] "  Chief Complaint: Covid positive Symptoms: Tested positive covid, home test. Wife positive as well, 2 days earlier. Mild lightheadedness, mild headache, itchy throat,cough, loss of sense of smell.Also reports H/O essential tremors, worsened and rapid HR last night "Anxiety" not present this AM.Symptoms onset Sunday. Fever of 102.0 "Few days ago" Afebrile presently. Frequency: Sunday Pertinent Negatives: Patient denies SOB, fever Disposition: []ED /[]Urgent Care (no appt availability in office) / []Appointment(In office/virtual)/ [] [place] [facility]/ [x]Home Care/ []Refused Recommended Disposition /[][place] [facility]/ [] Follow-up with PCP Additional Notes: Pt interested in Paxlovid if Dr. Fisher recommends. Would like PCPs input. "If it gets me through this easier I\'m all for it. Wife took it and helped her a lot." No availability for virtual, OV today.Pt considered high risk per protocol. Please advise  Reason for Disposition  [1] HIGH RISK patient (e.g., weak immune system, age > 64 years, obesity with BMI 30 or higher, pregnant, chronic lung disease or other chronic medical condition) AND [2] COVID symptoms (e.g., cough, fever)  (Exceptions: Already seen by PCP and no new or worsening symptoms.)    Pt at high risk  Answer Assessment - Initial Assessment Questions 1. COVID-19 DIAGNOSIS: "How do you know that you have COVID?" (e.g., positive lab test or self-test, diagnosed by doctor or NP/PA, symptoms after exposure).     Home test 2. COVID-19 EXPOSURE: "Was there any known exposure to COVID before the symptoms began?" CDC Definition of close contact: within 6 feet (2 meters) for a total of 15 minutes  or more over a 24-hour period.      WIfe positive 3. ONSET: "When did the COVID-19 symptoms start?"      Sunday 4. WORST SYMPTOM: "What is your worst symptom?" (e.g., cough, fever, shortness of breath, muscle aches)      5. COUGH: "Do you have a cough?" If Yes, ask: "How bad is the cough?"       Yes, mild non productive 6. FEVER: "Do you have a fever?" If Yes, ask: "What is your temperature, how was it measured, and when did it start?"     102.0 fever few days ago, afebrile presently 7. RESPIRATORY STATUS: "Describe your breathing?" (e.g., normal; shortness of breath, wheezing, unable to speak)      No 8. BETTER-SAME-WORSE: "Are you getting better, staying the same or getting worse compared to yesterday?"  If getting worse, ask, "In what way?"     "Different"  9. OTHER SYMPTOMS: "Do you have any other symptoms?"  (e.g., chills, fatigue, headache, loss of smell or taste, muscle pain, sore throat)     No taste, headache,mild lightheaded, no fever, itchy throat, runny nose. Rapid heart rate last night, not presently "Anxiety"  Essential tremors somewhat worse. 10. HIGH RISK DISEASE: "Do you have any chronic medical problems?" (e.g., asthma, heart or lung disease, weak immune system, obesity, etc.)        11'$ . VACCINE: "Have you had the COVID-19 vaccine?" If Yes, ask: "Which one, how many shots, when did you get it?"       2 vaccines all boosters 13. O2 SATURATION MONITOR:  "Do you use an oxygen saturation monitor (pulse oximeter) at home?" If Yes, ask "What  is your reading (oxygen level) today?" "What is your usual oxygen saturation reading?" (e.g., 95%)  Protocols used: Coronavirus (COVID-19) Diagnosed or Suspected-A-AH

## 2022-06-02 NOTE — Progress Notes (Deleted)
    I,Sha'taria Wen Munford,acting as a Education administrator for [employer], PA-C.,have documented all relevant documentation on the behalf of Mikey Kirschner, PA-C,as directed by  Mikey Kirschner, PA-C while in the presence of Mikey Kirschner, PA-C.  MyChart Video Visit    Virtual Visit via Video Note   This visit type was conducted due to national recommendations for restrictions regarding the COVID-19 Pandemic (e.g. social distancing) in an effort to limit this patient's exposure and mitigate transmission in our community. This patient is at least at moderate risk for complications without adequate follow up. This format is felt to be most appropriate for this patient at this time. Physical exam was limited by quality of the video and audio technology used for the visit.   Patient location: *** Provider location: [facility]  I discussed the limitations of evaluation and management by telemedicine and the availability of in person appointments. The patient expressed understanding and agreed to proceed.  Patient: Andrew [name]   DOB: [birth year]   76 y.o. Male  MRN: [id number] Visit Date: 06/03/2022  Today's healthcare provider: Mikey Kirschner, PA-C   No chief complaint on file.  Subjective    HPI  ***   Medications: Outpatient Medications Prior to Visit  Medication Sig   cholecalciferol (VITAMIN D) 1000 UNITS tablet Take 1,000 Units by mouth daily.   MAGNESIUM PO Take 1 tablet by mouth daily.   Multiple Vitamin (MULTI-VITAMIN DAILY) TABS Take 1 tablet by mouth daily.    propranolol ER (INDERAL LA) 80 MG 24 hr capsule TAKE 1 CAPSULE BY MOUTH EVERY DAY   saw palmetto 160 MG capsule Take 160 mg by mouth every other day.    Turmeric 450 MG CAPS Take 450 mg by mouth daily.    No facility-administered medications prior to visit.    Review of Systems  {Labs  Heme  Chem  Endocrine  Serology  Results Review (optional):23779}   Objective    There were no vitals taken for  this visit.  {Show previous vital signs (optional):23777}   Physical Exam     Assessment & Plan     ***  No follow-ups on file.     I discussed the assessment and treatment plan with the patient. The patient was provided an opportunity to ask questions and all were answered. The patient agreed with the plan and demonstrated an understanding of the instructions.   The patient was advised to call back or seek an in-person evaluation if the symptoms worsen or if the condition fails to improve as anticipated.  I provided *** minutes of non-face-to-face time during this encounter.  {provider attestation***:1}  Mikey Kirschner, PA-C [facility] [phone] (phone) [phone] (fax)  [place]

## 2022-06-03 ENCOUNTER — Telehealth: Payer: Medicare Other | Admitting: Physician Assistant

## 2022-07-13 ENCOUNTER — Ambulatory Visit (INDEPENDENT_AMBULATORY_CARE_PROVIDER_SITE_OTHER): Payer: Medicare Other | Admitting: Dermatology

## 2022-07-13 ENCOUNTER — Encounter: Payer: Self-pay | Admitting: Dermatology

## 2022-07-13 DIAGNOSIS — D1801 Hemangioma of skin and subcutaneous tissue: Secondary | ICD-10-CM | POA: Diagnosis not present

## 2022-07-13 DIAGNOSIS — L820 Inflamed seborrheic keratosis: Secondary | ICD-10-CM | POA: Diagnosis not present

## 2022-07-13 DIAGNOSIS — L570 Actinic keratosis: Secondary | ICD-10-CM

## 2022-07-13 DIAGNOSIS — Z1283 Encounter for screening for malignant neoplasm of skin: Secondary | ICD-10-CM

## 2022-07-13 DIAGNOSIS — L814 Other melanin hyperpigmentation: Secondary | ICD-10-CM | POA: Diagnosis not present

## 2022-07-13 DIAGNOSIS — L578 Other skin changes due to chronic exposure to nonionizing radiation: Secondary | ICD-10-CM

## 2022-07-13 DIAGNOSIS — Z85828 Personal history of other malignant neoplasm of skin: Secondary | ICD-10-CM | POA: Diagnosis not present

## 2022-07-13 DIAGNOSIS — D229 Melanocytic nevi, unspecified: Secondary | ICD-10-CM

## 2022-07-13 DIAGNOSIS — L821 Other seborrheic keratosis: Secondary | ICD-10-CM

## 2022-07-13 NOTE — Progress Notes (Signed)
Follow-Up Visit   Subjective  Andrew [name] is a 76 y.o. male who presents for the following: Annual Exam.  The patient presents for Upper-Body Skin Exam (UBSE) for skin cancer screening and mole check.  The patient has spots, moles and lesions to be evaluated, some may be new or changing, including left and right upper arm and forehead (pt picks at some areas). He has a history of BCC of the right upper ear helix and right temple.    The following portions of the chart were reviewed this encounter and updated as appropriate:       Review of Systems:  No other skin or systemic complaints except as noted in HPI or Assessment and Plan.  Objective  Well appearing patient in no apparent distress; mood and affect are within normal limits.  A focused examination was performed including all skin waist up. Relevant physical exam findings are noted in the Assessment and Plan.  R upper arm x 1, upper forehead x 1, L forehead x 1, R preauricular x 1, R forehead x 1 (5) Erythematous stuck-on, waxy papule   crown x 13 Pink scaly macules.    Assessment & Plan  Skin cancer screening performed today.  Actinic Damage - Severe, confluent actinic changes with pre-cancerous actinic keratoses  - Severe, chronic, not at goal, secondary to cumulative UV radiation exposure over time - diffuse scaly erythematous macules and papules with underlying dyspigmentation - Discussed Prescription "Field Treatment" for Severe, Chronic Confluent Actinic Changes with Pre-Cancerous Actinic Keratoses Field treatment involves treatment of an entire area of skin that has confluent Actinic Changes (Sun/ Ultraviolet light damage) and PreCancerous Actinic Keratoses by method of PhotoDynamic Therapy (PDT) and/or prescription Topical Chemotherapy agents such as 5-fluorouracil, 5-fluorouracil/calcipotriene, and/or imiquimod.  The purpose is to decrease the number of clinically evident and subclinical PreCancerous lesions to  prevent progression to development of skin cancer by chemically destroying early precancer changes that may or may not be visible.  It has been shown to reduce the risk of developing skin cancer in the treated area. As a result of treatment, redness, scaling, crusting, and open sores may occur during treatment course. One or more than one of these methods may be used and may have to be used several times to control, suppress and eliminate the PreCancerous changes. Discussed treatment course, expected reaction, and possible side effects. - Recommend daily broad spectrum sunscreen SPF 30+ to sun-exposed areas, reapply every 2 hours as needed.  - Staying in the shade or wearing long sleeves, sun glasses (UVA+UVB protection) and wide brim hats (4-inch brim around the entire circumference of the hat) are also recommended. - Call for new or changing lesions. -- Start 5-fluorouracil/calcipotriene cream twice a day for 7-10 days to affected areas including scalp. Prescription sent to [facility]. Patient advised they will receive an email to purchase the medication online and have it sent to their home. Patient provided with handout reviewing treatment course and side effects and advised to call or message [place] on MyChart with any concerns.  History of Basal Cell Carcinoma of the Skin - No evidence of recurrence today of the R upper ear helix, R temple - Recommend regular full body skin exams - Recommend daily broad spectrum sunscreen SPF 30+ to sun-exposed areas, reapply every 2 hours as needed.  - Call if any new or changing lesions are noted between office visits  Seborrheic Keratoses - Stuck-on, waxy, tan-brown papules and/or plaques, including left upper  arm  - Benign-appearing - Discussed benign etiology and prognosis. - Observe - Call for any changes  Lentigines - Scattered tan macules - Due to sun exposure - Benign-appearing, observe - Recommend daily broad spectrum  sunscreen SPF 30+ to sun-exposed areas, reapply every 2 hours as needed. - Call for any changes  Hemangiomas - Red papules - Discussed benign nature - Observe - Call for any changes  Melanocytic Nevi - Tan-brown and/or pink-flesh-colored symmetric macules and papules - Benign appearing on exam today - Observation - Call clinic for new or changing moles - Recommend daily use of broad spectrum spf 30+ sunscreen to sun-exposed areas.   Inflamed seborrheic keratosis (5) R upper arm x 1, upper forehead x 1, L forehead x 1, R preauricular x 1, R forehead x 1  Symptomatic, irritating, patient would like treated.  Destruction of lesion - R upper arm x 1, upper forehead x 1, L forehead x 1, R preauricular x 1, R forehead x 1  Destruction method: cryotherapy   Informed consent: discussed and consent obtained   Lesion destroyed using liquid nitrogen: Yes   Region frozen until ice ball extended beyond lesion: Yes   Outcome: patient tolerated procedure well with no complications   Post-procedure details: wound care instructions given   Additional details:  Prior to procedure, discussed risks of blister formation, small wound, skin dyspigmentation, or rare scar following cryotherapy. Recommend Vaseline ointment to treated areas while healing.   AK (actinic keratosis) crown x 13  Actinic keratoses are precancerous spots that appear secondary to cumulative UV radiation exposure/sun exposure over time. They are chronic with expected duration over 1 year. A portion of actinic keratoses will progress to squamous cell carcinoma of the skin. It is not possible to reliably predict which spots will progress to skin cancer and so treatment is recommended to prevent development of skin cancer.  Recommend daily broad spectrum sunscreen SPF 30+ to sun-exposed areas, reapply every 2 hours as needed.  Recommend staying in the shade or wearing long sleeves, sun glasses (UVA+UVB protection) and wide brim hats  (4-inch brim around the entire circumference of the hat). Call for new or changing lesions.  Destruction of lesion - crown x 13  Destruction method: cryotherapy   Informed consent: discussed and consent obtained   Lesion destroyed using liquid nitrogen: Yes   Region frozen until ice ball extended beyond lesion: Yes   Outcome: patient tolerated procedure well with no complications   Post-procedure details: wound care instructions given   Additional details:  Prior to procedure, discussed risks of blister formation, small wound, skin dyspigmentation, or rare scar following cryotherapy. Recommend Vaseline ointment to treated areas while healing.    Return in about 1 year (around 07/14/2023) for TBSE.  IJamesetta Orleans, CMA, am acting as scribe for Brendolyn Patty, MD .  Documentation: I have reviewed the above documentation for accuracy and completeness, and I agree with the above.  Brendolyn Patty MD

## 2022-07-13 NOTE — Patient Instructions (Addendum)
Cryotherapy Aftercare  Wash gently with soap and water everyday.   Apply Vaseline and Band-Aid daily until healed.    Instructions for [facility] Medications  One or more of your medications was sent to the [facility] mail order compounding pharmacy. You will receive an email from them and can purchase the medicine through that link. It will then be mailed to your home at the address you confirmed. If for any reason you do not receive an email from them, please check your spam folder. If you still do not find the email, please let us know. [facility] phone number is [phone].    5-Fluorouracil/Calcipotriene Patient Education   Actinic keratoses are the dry, red scaly spots on the skin caused by sun damage. A portion of these spots can turn into skin cancer with time, and treating them can help prevent development of skin cancer.   Treatment of these spots requires removal of the defective skin cells. There are various ways to remove actinic keratoses, including freezing with liquid nitrogen, treatment with creams, or treatment with a blue light procedure in the office.   5-fluorouracil cream is a topical cream used to treat actinic keratoses. It works by interfering with the growth of abnormal fast-growing skin cells, such as actinic keratoses. These cells peel off and are replaced by healthy ones.   5-fluorouracil/calcipotriene is a combination of the 5-fluorouracil cream with a vitamin D analog cream called calcipotriene. The calcipotriene alone does not treat actinic keratoses. However, when it is combined with 5-fluorouracil, it helps the 5-fluorouracil treat the actinic keratoses much faster so that the same results can be achieved with a much shorter treatment time.  INSTRUCTIONS FOR 5-FLUOROURACIL/CALCIPOTRIENE CREAM:   5-fluorouracil/calcipotriene cream typically only needs to be used for 7-10 days to the scalp. A thin layer should be applied twice a day to the  treatment areas recommended by your physician.   If your physician prescribed you separate tubes of 5-fluourouracil and calcipotriene, apply a thin layer of 5-fluorouracil followed by a thin layer of calcipotriene.   Avoid contact with your eyes, nostrils, and mouth. Do not use 5-fluorouracil/calcipotriene cream on infected or open wounds.   You will develop redness, irritation and some crusting at areas where you have pre-cancer damage/actinic keratoses. IF YOU DEVELOP PAIN, BLEEDING, OR SIGNIFICANT CRUSTING, STOP THE TREATMENT EARLY - you have already gotten a good response and the actinic keratoses should clear up well.  Wash your hands after applying 5-fluorouracil 5% cream on your skin.   A moisturizer or sunscreen with a minimum SPF 30 should be applied each morning.   Once you have finished the treatment, you can apply a thin layer of Vaseline twice a day to irritated areas to soothe and calm the areas more quickly. If you experience significant discomfort, contact your physician.  For some patients it is necessary to repeat the treatment for best results.  SIDE EFFECTS: When using 5-fluorouracil/calcipotriene cream, you may have mild irritation, such as redness, dryness, swelling, or a mild burning sensation. This usually resolves within 2 weeks. The more actinic keratoses you have, the more redness and inflammation you can expect during treatment. Eye irritation has been reported rarely. If this occurs, please let us know.  If you have any trouble using this cream, please call the office. If you have any other questions about this information, please do not hesitate to ask me before you leave the office.    Due to recent changes in healthcare laws, you  may see results of your pathology and/or laboratory studies on MyChart before the doctors have had a chance to review them. We understand that in some cases there may be results that are confusing or concerning to you. Please understand  that not all results are received at the same time and often the doctors may need to interpret multiple results in order to provide you with the best plan of care or course of treatment. Therefore, we ask that you please give [place] 2 business days to thoroughly review all your results before contacting the office for clarification. Should we see a critical lab result, you will be contacted sooner.   If You Need Anything After Your Visit  If you have any questions or concerns for your doctor, please call our main line at [phone] and press option 4 to reach your doctor's medical assistant. If no one answers, please leave a voicemail as directed and we will return your call as soon as possible. Messages left after 4 pm will be answered the following business day.   You may also send [place] a message via [place]. We typically respond to MyChart messages within 1-2 business days.  For prescription refills, please ask your pharmacy to contact our office. Our fax number is [phone].  If you have an urgent issue when the clinic is closed that cannot wait until the next business day, you can page your doctor at the number below.    Please note that while we do our best to be available for urgent issues outside of office hours, we are not available 24/7.   If you have an urgent issue and are unable to reach [place], you may choose to seek medical care at your doctor's office, retail clinic, urgent care center, or emergency room.  If you have a medical emergency, please immediately call 911 or go to the emergency department.  Pager Numbers  - Dr. Nehemiah Massed: [phone]  - Dr. Laurence Ferrari: [phone]  - Dr. Nicole Kindred: [phone]  In the event of inclement weather, please call our main line at [phone] for an update on the status of any delays or closures.  Dermatology Medication Tips: Please keep the boxes that topical medications come in in order to help keep track of the instructions about where and  how to use these. Pharmacies typically print the medication instructions only on the boxes and not directly on the medication tubes.   If your medication is too expensive, please contact our office at [phone] option 4 or send [place] a message through [place].   We are unable to tell what your co-pay for medications will be in advance as this is different depending on your insurance coverage. However, we may be able to find a substitute medication at lower cost or fill out paperwork to get insurance to cover a needed medication.   If a prior authorization is required to get your medication covered by your insurance company, please allow [place] 1-2 business days to complete this process.  Drug prices often vary depending on where the prescription is filled and some pharmacies may offer cheaper prices.  The website www.goodrx.com contains coupons for medications through different pharmacies. The prices here do not account for what the cost may be with help from insurance (it may be cheaper with your insurance), but the website can give you the price if you did not use any insurance.  - You can print the associated coupon and take it with your prescription to the pharmacy.  -  You may also stop by our office during regular business hours and pick up a GoodRx coupon card.  - If you need your prescription sent electronically to a different pharmacy, notify our office through [facility] or by phone at [phone] option 4.     Si Usted Necesita Algo Despus de Su Visita  Tambin puede enviarnos un mensaje a travs de Pharmacist, community. Por lo general respondemos a los mensajes de MyChart en el transcurso de 1 a 2 das hbiles.  Para renovar recetas, por favor pida a su farmacia que se ponga en contacto con nuestra oficina. Harland Dingwall de fax es [place] [phone].  Si tiene un asunto urgente cuando la clnica est cerrada y que no puede esperar hasta el siguiente da hbil, puede llamar/localizar a su  doctor(a) al nmero que aparece a continuacin.   Por favor, tenga en cuenta que aunque hacemos todo lo posible para estar disponibles para asuntos urgentes fuera del horario de [place], no estamos disponibles las 24 horas del da, los 7 das de la [place].   Si tiene un problema urgente y no puede comunicarse con nosotros, puede optar por buscar atencin mdica  en el consultorio de su doctor(a), en una clnica privada, en un centro de atencin urgente o en una sala de emergencias.  Si tiene Engineering geologist, por favor llame inmediatamente al 911 o vaya a la sala de emergencias.  Nmeros de bper  - Dr. Nehemiah Massed: [phone]  - Dra. Moye: [phone]  - Dra. Nicole Kindred: [phone]  En caso de inclemencias del [place], por favor llame a Johnsie Kindred principal al [phone] para una actualizacin sobre el [place] de cualquier retraso o cierre.  Consejos para la medicacin en dermatologa: Por favor, guarde las cajas en las que vienen los medicamentos de uso tpico para ayudarle a seguir las instrucciones sobre dnde y cmo usarlos. Las farmacias generalmente imprimen las instrucciones del medicamento slo en las cajas y no directamente en los tubos del [place].   Si su medicamento es muy caro, por favor, pngase en contacto con Zigmund Daniel llamando al [phone] y presione la opcin 4 o envenos un mensaje a travs de Pharmacist, community.   No podemos decirle cul ser su copago por los medicamentos por adelantado ya que esto es diferente dependiendo de la cobertura de su seguro. Sin embargo, es posible que podamos encontrar un medicamento sustituto a Electrical engineer un formulario para que el seguro cubra el medicamento que se considera necesario.   Si se requiere una autorizacin previa para que su compaa de seguros [place] su medicamento, por favor permtanos de 1 a 2 das hbiles para completar este proceso.  Los precios de los medicamentos varan con frecuencia dependiendo del  Environmental consultant de dnde se surte la receta y alguna farmacias pueden ofrecer precios ms baratos.  El sitio web www.goodrx.com tiene cupones para medicamentos de Airline pilot. Los precios aqu no tienen en cuenta lo que podra costar con la ayuda del seguro (puede ser ms barato con su seguro), pero el sitio web puede darle el precio si no utiliz Research scientist (physical sciences).  - Puede imprimir el cupn correspondiente y llevarlo con su receta a la farmacia.  - Tambin puede pasar por nuestra oficina durante el horario de atencin regular y Charity fundraiser una tarjeta de cupones de GoodRx.  - Si necesita que su receta se enve electrnicamente a Chiropodist, informe a nuestra oficina a travs de MyChart de [place] o por telfono llamando al [phone] y  presione la opcin 4.

## 2022-07-15 DIAGNOSIS — Z23 Encounter for immunization: Secondary | ICD-10-CM | POA: Diagnosis not present

## 2022-07-28 ENCOUNTER — Ambulatory Visit: Payer: Medicare Other | Attending: Internal Medicine | Admitting: Internal Medicine

## 2022-07-28 ENCOUNTER — Encounter: Payer: Self-pay | Admitting: Internal Medicine

## 2022-07-28 VITALS — BP 120/84 | HR 79 | Ht 71.0 in | Wt 153.0 lb

## 2022-07-28 DIAGNOSIS — R002 Palpitations: Secondary | ICD-10-CM

## 2022-07-28 DIAGNOSIS — I700 Atherosclerosis of aorta: Secondary | ICD-10-CM | POA: Diagnosis not present

## 2022-07-28 DIAGNOSIS — R0789 Other chest pain: Secondary | ICD-10-CM | POA: Diagnosis not present

## 2022-07-28 DIAGNOSIS — E7800 Pure hypercholesterolemia, unspecified: Secondary | ICD-10-CM | POA: Diagnosis not present

## 2022-07-28 DIAGNOSIS — I471 Supraventricular tachycardia, unspecified: Secondary | ICD-10-CM | POA: Diagnosis not present

## 2022-07-28 DIAGNOSIS — I4729 Other ventricular tachycardia: Secondary | ICD-10-CM

## 2022-07-28 NOTE — Progress Notes (Signed)
Follow-up Outpatient Visit Date: 07/28/2022  Primary Care Provider: Mikey Kirschner, PA-C [address] [place] [address]  Chief Complaint: Follow-up palpitations and atypical chest pain  HPI:  Andrew [name] is a 76 y.o. male with history of atypical chest pain, palpitations with monitor showing brief runs of PSVT and NSVT, aortic atherosclerosis, hiatal hernia, and anxiety, who presents for follow-up of chest pain and palpitations.  I last saw him a year ago, which time he was feeling fairly well with only a single episode of a "odd feeling" in his chest.  We did not make any medication changes or pursue additional testing.  Today, Andrew [name] reports that he has been doing fairly well.  He had COVID-19 about a month ago but is almost back to normal.  His only residual symptom is intermittent headaches.  Random episodes of atypical chest pain have been less frequent than at prior visits.  He feels like it is mostly related to anxiety.  He does not have any exertional chest pain.  Palpitations have also been minimal, happening less than once a month and only lasting a few seconds at a time.  He denies shortness of breath, lightheadedness, and edema.  Home blood pressures are typically in the 120's/60's.  --------------------------------------------------------------------------------------------------  Past Medical History:  Diagnosis Date   Actinic keratosis    Atypical chest pain    a. Rest pain only; b. 08/2016 ETT: Ex time 33mns, no ST/T changes ->nl study.   Basal cell carcinoma 08/11/2015   Right temple. Superficial and nodular patterns. Excised 09/02/2015, margins free.   Basal cell carcinoma 05/16/2018   Right upper third ear helix. Nodular pattern. txd with EDC   Headache    H/O MIGRAINES   History of anxiety    Hypertension    NSVT (nonsustained ventricular tachycardia) ([place]    a. 03/2017 Zio Monitor: 2 episodes of NSVT - up to 7 beats, max HR 207.    Palpitations    a. 03/2017 14 day Zio Monitor: PACs, PVCs, brief PSVT and NSVT-->managed w/ beta blocker.   Precancerous lesion    PSVT (paroxysmal supraventricular tachycardia)    a. 03/2017  Zio monitor: 7 episodes of PSVT - up to 6 beats, max rate 193.   Valvular heart disease    a. 06/2017 Echo: EF 60-65%. No rwma. Mild MR. Mild to mod TR. PASP 327mg.   Past Surgical History:  Procedure Laterality Date   EYE SURGERY Right    Done as a child   INGUINAL HERNIA REPAIR Right 03/20/2018   Large Ultrapro mesh.  Surgeon: ByRobert BellowMD;  Location: [facility] ORS;  Service: General;  Laterality: Right;   VASECTOMY     30 yrs ago?    Current Meds  Medication Sig   cholecalciferol (VITAMIN D) 1000 UNITS tablet Take 1,000 Units by mouth daily.   MAGNESIUM PO Take 1 tablet by mouth daily.   Multiple Vitamin (MULTI-VITAMIN DAILY) TABS Take 1 tablet by mouth daily.    propranolol ER (INDERAL LA) 80 MG 24 hr capsule TAKE 1 CAPSULE BY MOUTH EVERY DAY   saw palmetto 160 MG capsule Take 160 mg by mouth every other day.    Turmeric 450 MG CAPS Take 450 mg by mouth daily.     Allergies: Patient has no known allergies.  Social History   Tobacco Use   Smoking status: Never   Smokeless tobacco: Never  Vaping Use   Vaping Use: Never used  Substance Use Topics  Alcohol use: Yes    Alcohol/week: 1.0 standard drink of alcohol    Types: 1 Glasses of wine per week    Comment: 1 glass of wine per week   Drug use: No    Family History  Problem Relation Age of Onset   Heart attack Father     Review of Systems: A 12-system review of systems was performed and was negative except as noted in the HPI.  --------------------------------------------------------------------------------------------------  Physical Exam: BP 120/84 (BP Location: Left Arm, Patient Position: Sitting, Cuff Size: Normal)   Pulse 79   Ht '5\' 11"'$  (1.803 m)   Wt 153 lb (69.4 kg)   SpO2 99%   BMI 21.34 kg/m    General:  NAD. Neck: No JVD or HJR. Lungs: Clear to auscultation bilaterally without wheezes or crackles. Heart: Regular rate and rhythm without murmurs, rubs, or gallops. Abdomen: Soft, nontender, nondistended. Extremities: No lower extremity edema.  EKG: Normal sinus rhythm without abnormality.  Heart rate has decreased from prior tracing on 07/24/2021.  Otherwise, no significant interval change.  Lab Results  Component Value Date   WBC 3.9 04/23/2022   HGB 13.3 04/23/2022   HCT 38.0 04/23/2022   MCV 93 04/23/2022   PLT 129 (L) 04/23/2022    Lab Results  Component Value Date   NA 138 04/23/2022   K 5.4 (H) 04/23/2022   CL 102 04/23/2022   CO2 24 04/23/2022   BUN 21 04/23/2022   CREATININE 1.07 04/23/2022   GLUCOSE 104 (H) 04/23/2022   ALT 16 04/23/2022    Lab Results  Component Value Date   CHOL 173 04/23/2022   HDL 49 04/23/2022   LDLCALC 115 (H) 04/23/2022   TRIG 46 04/23/2022   CHOLHDL 3.5 04/23/2022    --------------------------------------------------------------------------------------------------  ASSESSMENT AND PLAN: Atypical chest pain: Pain remains infrequent and is not consistent with coronary insufficiency.  Coronary CTA in 2021 was reassuring with absence of CAD.  No further work-up recommended at this time.  Palpitations with PSVT and NSVT: Palpitations have been minimal.  Continue current dose of propranolol.  Aortic atherosclerosis and hyperlipidemia: LDL has improved with lifestyle modifications though it is still slightly elevated on last check in July.  In the setting of mild aortic atherosclerosis noted on prior coronary CTA, we discussed importance of lipid control through lifestyle modifications and pharmacotherapy.  We have agreed to continue working on lifestyle modifications to target an LDL less than 100.  If Andrew [name] is unable to achieve this level, addition of low-moderate intensity statin therapy will need to be  readdressed.  Follow-up: Return to clinic in 1 year.  [name], MD 07/28/2022 11:38 AM

## 2022-07-28 NOTE — Patient Instructions (Signed)
Medication Instructions:  Your physician recommends that you continue on your current medications as directed. Please refer to the Current Medication list given to you today. *If you need a refill on your cardiac medications before your next appointment, please call your pharmacy*   Lab Work: none If you have labs (blood work) drawn today and your tests are completely normal, you will receive your results only by: [place] (if you have MyChart) OR A paper copy in the mail If you have any lab test that is abnormal or we need to change your treatment, we will call you to review the results.   Testing/Procedures: None  Follow-Up: At [facility], you and your health needs are our priority.  As part of our continuing mission to provide you with exceptional heart care, we have created designated Provider Care Teams.  These Care Teams include your primary Cardiologist (physician) and Advanced Practice Providers (APPs -  Physician Assistants and Nurse Practitioners) who all work together to provide you with the care you need, when you need it.  We recommend signing up for the patient portal called "MyChart".  Sign up information is provided on this After Visit Summary.  MyChart is used to connect with patients for Virtual Visits (Telemedicine).  Patients are able to view lab/test results, encounter notes, upcoming appointments, etc.  Non-urgent messages can be sent to your provider as well.   To learn more about what you can do with MyChart, go to [email or web address].    Your next appointment:   1 year(s)  The format for your next appointment:   In Person  Provider:   You may see [name], MD or one of the following Advanced Practice Providers on your designated Care Team:   Murray Hodgkins, NP Christell Faith, PA-C Cadence Kathlen Mody, PA-C Gerrie Nordmann, NP    Other Instructions N/A  Important Information About Sugar

## 2022-09-15 DIAGNOSIS — Z23 Encounter for immunization: Secondary | ICD-10-CM | POA: Diagnosis not present

## 2022-09-20 ENCOUNTER — Other Ambulatory Visit: Payer: Self-pay | Admitting: Internal Medicine

## 2023-04-04 ENCOUNTER — Telehealth: Payer: Self-pay | Admitting: Physician Assistant

## 2023-04-08 DIAGNOSIS — H2513 Age-related nuclear cataract, bilateral: Secondary | ICD-10-CM | POA: Diagnosis not present

## 2023-04-08 DIAGNOSIS — H5334 Suppression of binocular vision: Secondary | ICD-10-CM | POA: Diagnosis not present

## 2023-04-21 DIAGNOSIS — Z23 Encounter for immunization: Secondary | ICD-10-CM | POA: Diagnosis not present

## 2023-04-25 ENCOUNTER — Encounter: Payer: Medicare Other | Admitting: Physician Assistant

## 2023-04-26 ENCOUNTER — Encounter: Payer: Medicare Other | Admitting: Family Medicine

## 2023-04-26 NOTE — Progress Notes (Signed)
Annual Wellness Visit     Patient: Andrew [name], Male    DOB: [birth year], 77 y.o.   MRN: [id number] Visit Date: 04/27/2023  Today's Provider: Sherlyn Hay, DO   Chief Complaint  Patient presents with   Annual Exam   Subjective    Andrew [name] is a 77 y.o. male who presents today for his Annual Wellness Visit. He reports consuming a general diet. Home exercise routine includes walking throughout the day. He generally feels well. He reports sleeping well. He does not have additional problems to discuss today.   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 Doesn't consider a problem to address today. Right knee a little bothersome; doesn't affect walking. Doesn't want to address today.  Walks and stairs any chance he can. Push mows and uses riding mower (both each time). Has hit 6000 steps twice this week already Does experience  intermittent chest pain; following with cardiology  Likes cars a lot as a hobby, frequently to work on over the years; bought a red BMW convertible through an auction ("last toy").  Doesn't like going out to eat; will get a salad when going out. Typically eat fish or poultry; rarely eat red meat.  Will sometimes eat canadian bacon. Also will eat a bowl of blueberries with cereal in it or egg fried with jalapenos and canadian bacon. Every Friday will have a glass of wine and trail mix ("gorp") for dinner  Tickle in throat; any use of powder will make him cough  Mom died of lung cancer Dad smoked 3 ppd until his 1st MI Occupational exposures - aircraft and cars.  Brakes and clutches with significant exposure to asbestos consistently for over ten years.    Medications: Outpatient Medications Prior to Visit  Medication Sig   cholecalciferol (VITAMIN D) 1000 UNITS tablet Take 1,000 Units by mouth daily.   MAGNESIUM PO Take 1 tablet by mouth daily.   Multiple Vitamin (MULTI-VITAMIN DAILY) TABS Take 1 tablet by mouth  daily.    propranolol ER (INDERAL LA) 80 MG 24 hr capsule TAKE 1 CAPSULE BY MOUTH EVERY DAY   saw palmetto 160 MG capsule Take 160 mg by mouth every other day.    Turmeric 450 MG CAPS Take 450 mg by mouth daily.    No facility-administered medications prior to visit.    No Known Allergies  Patient Care Team: Jobani Sabado, Monico Blitz, DO as PCP - General (Family Medicine) End, Cristal Deer, MD as PCP - Cardiology (Cardiology) Creig Hines, NP as Nurse Practitioner (Nurse Practitioner) Willeen Niece, MD as Consulting Physician (Dermatology)  Review of Systems  Constitutional:  Negative for appetite change, chills and fever.  Respiratory:  Negative for chest tightness, shortness of breath and wheezing.   Cardiovascular:  Positive for chest pain (intermittent, random intervals at baseline). Negative for palpitations.  Gastrointestinal:  Negative for abdominal pain, nausea and vomiting.  Musculoskeletal:  Positive for arthralgias (right knee).          Objective    Vitals: BP 120/72 (BP Location: Left Arm, Patient Position: Sitting, Cuff Size: Normal)   Pulse 77   Temp 98.4 F (36.9 C) (Oral)   Ht 5\' 11"  (1.803 m)   Wt 155 lb (70.3 kg)   SpO2 99%   BMI 21.62 kg/m      The 10-year ASCVD risk score (Arnett DK, et al., 2019) is: 24.8%   Values used to calculate the score:  Age: 83 years     Sex: Male     Is Non-Hispanic African American: No     Diabetic: No     Tobacco smoker: No     Systolic Blood Pressure: 120 mmHg     Is BP treated: Yes     HDL Cholesterol: 52 mg/dL     Total Cholesterol: 184 mg/dL   Physical Exam Vitals reviewed.  Constitutional:      General: He is not in acute distress.    Appearance: Normal appearance. He is well-developed. He is not diaphoretic.  HENT:     Head: Normocephalic and atraumatic.     Right Ear: Tympanic membrane, ear canal and external ear normal.     Left Ear: Tympanic membrane, ear canal and external ear normal.      Nose: Nose normal.     Mouth/Throat:     Mouth: Mucous membranes are moist.     Pharynx: Oropharynx is clear. No oropharyngeal exudate.  Eyes:     General: No scleral icterus.    Conjunctiva/sclera: Conjunctivae normal.     Pupils: Pupils are equal, round, and reactive to light.  Neck:     Thyroid: No thyromegaly.  Cardiovascular:     Rate and Rhythm: Normal rate and regular rhythm.     Pulses: Normal pulses.     Heart sounds: Normal heart sounds. No murmur heard. Pulmonary:     Effort: Pulmonary effort is normal. No respiratory distress.     Breath sounds: Normal breath sounds. No wheezing or rales.  Abdominal:     General: There is no distension.     Palpations: Abdomen is soft.     Tenderness: There is no abdominal tenderness.  Musculoskeletal:        General: No deformity.     Cervical back: Neck supple.     Right lower leg: No edema.     Left lower leg: No edema.  Lymphadenopathy:     Cervical: No cervical adenopathy.  Skin:    General: Skin is warm and dry.     Findings: No rash.  Neurological:     Mental Status: He is alert and oriented to person, place, and time. Mental status is at baseline.     Gait: Gait normal.  Psychiatric:        Mood and Affect: Mood normal.        Behavior: Behavior normal.        Thought Content: Thought content normal.     Most recent functional status assessment:    04/25/2023    6:57 PM  In your present state of health, do you have any difficulty performing the following activities:  Hearing? 0  Vision? 0  Difficulty concentrating or making decisions? 0  Walking or climbing stairs? 0  Dressing or bathing? 0  Doing errands, shopping? 0  Preparing Food and eating ? N  Using the Toilet? N  In the past six months, have you accidently leaked urine? N  Do you have problems with loss of bowel control? N  Managing your Medications? N  Managing your Finances? N  Housekeeping or managing your Housekeeping? N   Most recent fall risk  assessment:    04/25/2023    6:57 PM  Fall Risk   Falls in the past year? 0  Number falls in past yr: 0  Injury with Fall? 0    Most recent depression screenings:    04/27/2023   10:27 AM 04/21/2022   10:44  AM  PHQ 2/9 Scores  PHQ - 2 Score 0 0  PHQ- 9 Score  0   Most recent cognitive screening:    04/27/2023   11:04 AM  6CIT Screen  What Year? 0 points  What month? 0 points  What time? 0 points  Count back from 20 0 points  Months in reverse 0 points  Repeat phrase 0 points  Total Score 0 points   Most recent Audit-C alcohol use screening    04/25/2023    6:57 PM  Alcohol Use Disorder Test (AUDIT)  1. How often do you have a drink containing alcohol? 2  2. How many drinks containing alcohol do you have on a typical day when you are drinking? 0  3. How often do you have six or more drinks on one occasion? 0  AUDIT-C Score 2   A score of 3 or more in women, and 4 or more in men indicates increased risk for alcohol abuse, EXCEPT if all of the points are from question 1     Assessment & Plan     Annual wellness visit done today including the all of the following: Reviewed patient's Family Medical History Reviewed and updated list of patient's medical providers Assessment of cognitive impairment was done Assessed patient's functional ability Established a written schedule for health screening services Health Risk Assessent Completed and Reviewed  Exercise Activities and Dietary recommendations  Goals      Exercise 3x per week (30 min per time)     Recommend increasing exercise to 3 days a week for at least 30 minutes.         Immunization History  Administered Date(s) Administered   COVID-19, mRNA, vaccine(Comirnaty)12 years and older 04/20/2023   Fluad Quad(high Dose 65+) 07/24/2019   Hepatitis A, Adult 08/30/2017, 04/11/2018   Influenza, High Dose Seasonal PF 08/09/2017, 07/24/2018   PFIZER(Purple Top)SARS-COV-2 Vaccination 11/23/2019, 12/18/2019    Pneumococcal Conjugate-13 01/10/2018   Pneumococcal Polysaccharide-23 07/24/2019   Tdap 08/09/2017   Typhoid Inactivated 08/30/2017   Zoster Recombinant(Shingrix) 02/07/2019, 06/06/2019    Health Maintenance  Topic Date Due   INFLUENZA VACCINE  05/12/2023   Fecal DNA (Cologuard)  06/13/2023   COVID-19 Vaccine (4 - 2023-24 season) 06/15/2023   Medicare Annual Wellness (AWV)  04/26/2024   DTaP/Tdap/Td (2 - Td or Tdap) 08/10/2027   Pneumonia Vaccine 47+ Years old  Completed   Hepatitis C Screening  Completed   Zoster Vaccines- Shingrix  Completed   HPV VACCINES  Aged Out   Colonoscopy  Discontinued     Discussed health benefits of physical activity, and encouraged him to engage in regular exercise appropriate for his age and condition.    1. Medicare annual wellness visit, subsequent Overall benign physical exam with minimal concerns.  Primary concern is occupational exposure to that status.  Discussed options to screen for asbestos related lung disease.  Discussed that, in the [place], there is no specific recommended screening currently but that there is strong evidence for screening in general in the case of occupational exposure such as his.  Per shared decision making, we will go ahead and prescribe a high-resolution CT scan, for which patient will find out what his co-pay is and have it done or not depending on that.  If it is cost prohibitive, may consider chest x-ray and pulmonary function screen on an intermittent basis as an alternative option for screening.  However, I am concerned that this will not adequately screen for disease until  he is too far along in the disease process.  2. Essential tremor Managed by cardiology with propranolol.  3. Long term exposure to asbestos As noted above - CT Chest High Resolution; Future  4. Chest pain, unspecified type As noted above - CT Chest High Resolution; Future   Return in about 1 year (around 04/26/2024) for AWV, CPE.     I  discussed the assessment and treatment plan with the patient  The patient was provided an opportunity to ask questions and all were answered. The patient agreed with the plan and demonstrated an understanding of the instructions.   The patient was advised to call back or seek an in-person evaluation if the symptoms worsen or if the condition fails to improve as anticipated.    Sherlyn Hay, DO  [facility] [phone] (phone) [phone] (fax)  [facility]

## 2023-04-27 ENCOUNTER — Ambulatory Visit (INDEPENDENT_AMBULATORY_CARE_PROVIDER_SITE_OTHER): Payer: Medicare Other | Admitting: Family Medicine

## 2023-04-27 VITALS — BP 120/72 | HR 77 | Temp 98.4°F | Ht 71.0 in | Wt 155.0 lb

## 2023-04-27 DIAGNOSIS — Z0000 Encounter for general adult medical examination without abnormal findings: Secondary | ICD-10-CM | POA: Diagnosis not present

## 2023-04-27 DIAGNOSIS — R079 Chest pain, unspecified: Secondary | ICD-10-CM

## 2023-04-27 DIAGNOSIS — Z77090 Contact with and (suspected) exposure to asbestos: Secondary | ICD-10-CM

## 2023-04-27 DIAGNOSIS — R7989 Other specified abnormal findings of blood chemistry: Secondary | ICD-10-CM

## 2023-04-27 DIAGNOSIS — I10 Essential (primary) hypertension: Secondary | ICD-10-CM

## 2023-04-27 DIAGNOSIS — E041 Nontoxic single thyroid nodule: Secondary | ICD-10-CM

## 2023-04-27 DIAGNOSIS — G250 Essential tremor: Secondary | ICD-10-CM

## 2023-04-28 DIAGNOSIS — R946 Abnormal results of thyroid function studies: Secondary | ICD-10-CM | POA: Diagnosis not present

## 2023-04-28 DIAGNOSIS — I10 Essential (primary) hypertension: Secondary | ICD-10-CM | POA: Diagnosis not present

## 2023-04-28 DIAGNOSIS — Z0000 Encounter for general adult medical examination without abnormal findings: Secondary | ICD-10-CM | POA: Diagnosis not present

## 2023-04-29 LAB — COMPREHENSIVE METABOLIC PANEL
ALT: 17 IU/L (ref 0–44)
AST: 20 IU/L (ref 0–40)
Albumin: 4.1 g/dL (ref 3.8–4.8)
Alkaline Phosphatase: 67 IU/L (ref 44–121)
BUN/Creatinine Ratio: 15 (ref 10–24)
BUN: 16 mg/dL (ref 8–27)
Bilirubin Total: 0.6 mg/dL (ref 0.0–1.2)
CO2: 24 mmol/L (ref 20–29)
Calcium: 9.2 mg/dL (ref 8.6–10.2)
Chloride: 102 mmol/L (ref 96–106)
Creatinine, Ser: 1.04 mg/dL (ref 0.76–1.27)
Globulin, Total: 2.7 g/dL (ref 1.5–4.5)
Glucose: 105 mg/dL — ABNORMAL HIGH (ref 70–99)
Potassium: 4.8 mmol/L (ref 3.5–5.2)
Sodium: 140 mmol/L (ref 134–144)
Total Protein: 6.8 g/dL (ref 6.0–8.5)
eGFR: 75 mL/min/{1.73_m2} (ref >59)

## 2023-04-29 LAB — LIPID PANEL
Chol/HDL Ratio: 3.5 ratio (ref 0.0–5.0)
Cholesterol, Total: 184 mg/dL (ref 100–199)
HDL: 52 mg/dL (ref >39)
LDL Chol Calc (NIH): 122 mg/dL — ABNORMAL HIGH (ref 0–99)
Triglycerides: 52 mg/dL (ref 0–149)
VLDL Cholesterol Cal: 10 mg/dL (ref 5–40)

## 2023-04-29 LAB — TSH RFX ON ABNORMAL TO FREE T4: TSH: 0.297 u[IU]/mL — ABNORMAL LOW (ref 0.450–4.500)

## 2023-04-29 LAB — T4F: T4,Free (Direct): 1.71 ng/dL (ref 0.82–1.77)

## 2023-05-01 ENCOUNTER — Encounter: Payer: Self-pay | Admitting: Family Medicine

## 2023-05-03 NOTE — Addendum Note (Signed)
Addended by: Jacquenette Shone on: 05/03/2023 11:54 PM   Modules accepted: Level of Service

## 2023-05-10 ENCOUNTER — Encounter: Payer: Self-pay | Admitting: Family Medicine

## 2023-05-13 ENCOUNTER — Telehealth: Payer: Self-pay

## 2023-05-13 ENCOUNTER — Ambulatory Visit
Admission: RE | Admit: 2023-05-13 | Discharge: 2023-05-13 | Disposition: A | Discharge status: Home / Self Care | Payer: Medicare Other | Source: Ambulatory Visit | Attending: Family Medicine | Admitting: Family Medicine

## 2023-05-13 DIAGNOSIS — Z77090 Contact with and (suspected) exposure to asbestos: Secondary | ICD-10-CM | POA: Diagnosis not present

## 2023-05-13 DIAGNOSIS — R079 Chest pain, unspecified: Secondary | ICD-10-CM | POA: Diagnosis not present

## 2023-05-13 DIAGNOSIS — E041 Nontoxic single thyroid nodule: Secondary | ICD-10-CM | POA: Diagnosis not present

## 2023-05-13 NOTE — Telephone Encounter (Signed)
Copied from CRM [id number]. Topic: General - Inquiry >> May 13, 2023 10:41 AM Patsy Lager T wrote: Reason for CRM: patients spouse Elease Hashimoto called stated she has been calling Radiology back with not success. She said she has left vm's with 3 different departments hoping someone would call her back

## 2023-05-13 NOTE — Telephone Encounter (Signed)
Patient's wife states " we are back on track now". Reports that radiology had called yesterday around 2pm and they got home after 5 pm and didn't get to do the pre registration.

## 2023-05-26 ENCOUNTER — Other Ambulatory Visit: Payer: Self-pay | Admitting: Family Medicine

## 2023-05-26 DIAGNOSIS — E041 Nontoxic single thyroid nodule: Secondary | ICD-10-CM

## 2023-05-26 DIAGNOSIS — K449 Diaphragmatic hernia without obstruction or gangrene: Secondary | ICD-10-CM | POA: Insufficient documentation

## 2023-06-03 ENCOUNTER — Ambulatory Visit
Admission: RE | Admit: 2023-06-03 | Discharge: 2023-06-03 | Disposition: A | Discharge status: Home / Self Care | Payer: Medicare Other | Source: Ambulatory Visit | Attending: Family Medicine | Admitting: Family Medicine

## 2023-06-03 DIAGNOSIS — E041 Nontoxic single thyroid nodule: Secondary | ICD-10-CM | POA: Diagnosis not present

## 2023-06-03 DIAGNOSIS — E042 Nontoxic multinodular goiter: Secondary | ICD-10-CM | POA: Diagnosis not present

## 2023-06-08 ENCOUNTER — Other Ambulatory Visit: Payer: Self-pay | Admitting: Family Medicine

## 2023-06-08 ENCOUNTER — Encounter: Payer: Self-pay | Admitting: Family Medicine

## 2023-06-08 DIAGNOSIS — E041 Nontoxic single thyroid nodule: Secondary | ICD-10-CM

## 2023-06-10 ENCOUNTER — Other Ambulatory Visit: Payer: Self-pay | Admitting: Family Medicine

## 2023-06-10 DIAGNOSIS — Z1212 Encounter for screening for malignant neoplasm of rectum: Secondary | ICD-10-CM

## 2023-06-12 ENCOUNTER — Other Ambulatory Visit: Payer: Self-pay | Admitting: Internal Medicine

## 2023-06-23 DIAGNOSIS — Z1211 Encounter for screening for malignant neoplasm of colon: Secondary | ICD-10-CM | POA: Diagnosis not present

## 2023-06-23 DIAGNOSIS — Z1212 Encounter for screening for malignant neoplasm of rectum: Secondary | ICD-10-CM | POA: Diagnosis not present

## 2023-06-27 ENCOUNTER — Telehealth: Payer: Self-pay

## 2023-06-27 NOTE — Telephone Encounter (Signed)
Copied from CRM [id number]. Topic: General - Other >> Jun 27, 2023  1:02 PM Everette C wrote: Reason for CRM: The patient was told by their insurance that their labs from 04/28/23 were not covered   The patient was told that the cost of the services would be roughly $282.95 out of pocket   The patient('s) wife would like to inquire further about a possible reason for denial or miscommunication with insurance   Please contact further when possible

## 2023-06-30 DIAGNOSIS — R053 Chronic cough: Secondary | ICD-10-CM | POA: Diagnosis not present

## 2023-06-30 DIAGNOSIS — E041 Nontoxic single thyroid nodule: Secondary | ICD-10-CM | POA: Diagnosis not present

## 2023-07-04 ENCOUNTER — Other Ambulatory Visit: Payer: Self-pay | Admitting: Otolaryngology

## 2023-07-04 DIAGNOSIS — E041 Nontoxic single thyroid nodule: Secondary | ICD-10-CM

## 2023-07-05 NOTE — Telephone Encounter (Signed)
Left message for patient to call me back.

## 2023-07-07 NOTE — Progress Notes (Signed)
Patient for Andrew [name] guided FNA RT Inferior Thyroid Nodule Biopsy on Fri 07/08/2023, I called and spoke with the patient on the phone and gave pre-procedure instructions. Pt was made aware to be here at 12:30p and check in at the [facility] Pt stated understanding.  Called 07/07/2023

## 2023-07-08 ENCOUNTER — Ambulatory Visit
Admission: RE | Admit: 2023-07-08 | Discharge: 2023-07-08 | Disposition: A | Discharge status: Home / Self Care | Payer: Medicare Other | Source: Ambulatory Visit | Attending: Otolaryngology | Admitting: Otolaryngology

## 2023-07-08 DIAGNOSIS — E041 Nontoxic single thyroid nodule: Secondary | ICD-10-CM | POA: Insufficient documentation

## 2023-07-08 MED ORDER — LIDOCAINE HCL (PF) 1 % IJ SOLN
3.0000 mL | Freq: Once | INTRAMUSCULAR | Status: AC
  Administered 2023-07-08: 3 mL via INTRADERMAL

## 2023-07-08 NOTE — Procedures (Signed)
PROCEDURE SUMMARY:  Using direct ultrasound guidance, 5 passes were made using 25 g needles into the nodule within the right inferior lobe of the thyroid.   Ultrasound was used to confirm needle placements on all occasions.   EBL = trace  Specimens were sent to Pathology for analysis.  See procedure note under Imaging tab in Epic for full procedure details.  Kennieth Francois PA-C 07/08/2023 2:01 PM

## 2023-07-11 LAB — CYTOLOGY - NON PAP

## 2023-07-22 ENCOUNTER — Other Ambulatory Visit: Payer: Self-pay | Admitting: Family Medicine

## 2023-07-29 ENCOUNTER — Encounter: Payer: Self-pay | Admitting: Internal Medicine

## 2023-07-29 ENCOUNTER — Ambulatory Visit: Payer: Medicare Other | Attending: Internal Medicine | Admitting: Internal Medicine

## 2023-07-29 VITALS — BP 118/64 | HR 78 | Ht 71.0 in | Wt 154.0 lb

## 2023-07-29 DIAGNOSIS — E7800 Pure hypercholesterolemia, unspecified: Secondary | ICD-10-CM | POA: Diagnosis not present

## 2023-07-29 DIAGNOSIS — I4729 Other ventricular tachycardia: Secondary | ICD-10-CM | POA: Insufficient documentation

## 2023-07-29 DIAGNOSIS — R0789 Other chest pain: Secondary | ICD-10-CM | POA: Diagnosis not present

## 2023-07-29 DIAGNOSIS — I471 Supraventricular tachycardia, unspecified: Secondary | ICD-10-CM | POA: Insufficient documentation

## 2023-07-29 DIAGNOSIS — I700 Atherosclerosis of aorta: Secondary | ICD-10-CM | POA: Insufficient documentation

## 2023-07-29 DIAGNOSIS — R002 Palpitations: Secondary | ICD-10-CM | POA: Insufficient documentation

## 2023-07-29 NOTE — Patient Instructions (Signed)
Medication Instructions:  Your physician recommends that you continue on your current medications as directed. Please refer to the Current Medication list given to you today.   *If you need a refill on your cardiac medications before your next appointment, please call your pharmacy*   Lab Work: No labs ordered today    Testing/Procedures: No test ordered today    Follow-Up: At [facility], you and your health needs are our priority.  As part of our continuing mission to provide you with exceptional heart care, we have created designated Provider Care Teams.  These Care Teams include your primary Cardiologist (physician) and Advanced Practice Providers (APPs -  Physician Assistants and Nurse Practitioners) who all work together to provide you with the care you need, when you need it.  We recommend signing up for the patient portal called "MyChart".  Sign up information is provided on this After Visit Summary.  MyChart is used to connect with patients for Virtual Visits (Telemedicine).  Patients are able to view lab/test results, encounter notes, upcoming appointments, etc.  Non-urgent messages can be sent to your provider as well.   To learn more about what you can do with MyChart, go to [email or web address].    Your next appointment:   1 year(s)  Provider:   You may see Yvonne Kendall, MD or one of the following Advanced Practice Providers on your designated Care Team:   Nicolasa Ducking, NP Eula Listen, PA-C Cadence Fransico Michael, PA-C Charlsie Quest, NP

## 2023-07-29 NOTE — Progress Notes (Signed)
  Cardiology Office Note:  .   Date:  07/29/2023  ID:  Andrew [name], DOB 1946-04-22, MRN [id number] PCP: Sherlyn Hay, DO  [facility] Providers Cardiologist:  Yvonne Kendall, MD     History of Present Illness: .   Andrew [name] is a 77 y.o. male with history of atypical chest pain with normal coronary CTA (02/2020), palpitations with monitor showing brief runs of PSVT and NSVT, aortic atherosclerosis, hiatal hernia, and anxiety, who present for follow-up of chest pain and palpitations.  I last saw him a year ago, at which time he was recovering from COVID-19.  He noted random episodes of atypical chest pain, less frequent than at prior visits.  We did not make any medication changes or pursue additional testing.  Today, Andrew [name] continues to feel well.  He has rare sporadic chest pains and palpitations, similar or slightly less pronounced than at prior visits.  He believes that a magnesium supplement may be helping with his symptoms.  He remains on propranolol.  He has noticed that his hands and feet sometimes feel cold and wonders if this could be due to propranolol or other circulation issues.  He has not had any shortness of breath, lightheadedness, or edema.  He continues to walk regularly and watch his diet in an effort to control his cholesterol.  He is also thinking of purchasing a rowing machine.  ROS: See HPI  Studies Reviewed: Marland Kitchen   EKG Interpretation Date/Time:  Friday July 29 2023 10:03:42 EDT Ventricular Rate:  78 PR Interval:  146 QRS Duration:  104 QT Interval:  360 QTC Calculation: 410 R Axis:   27  Text Interpretation: Normal sinus rhythm Normal ECG When compared with ECG of 28-Jul-2022 No significant change was found Confirmed by Zaida Reiland, Cristal Deer [id number]) on 07/29/2023 10:08:52 AM    Risk Assessment/Calculations:             Physical Exam:   VS:  BP 118/64 (BP Location: Left Arm, Patient Position: Sitting, Cuff Size: Normal)   Pulse 78   Ht 5'  11" (1.803 m)   Wt 154 lb (69.9 kg)   SpO2 98%   BMI 21.48 kg/m    Wt Readings from Last 3 Encounters:  07/29/23 154 lb (69.9 kg)  04/27/23 155 lb (70.3 kg)  07/28/22 153 lb (69.4 kg)    General:  NAD. Neck: No JVD or HJR. Lungs: Clear to auscultation bilaterally without wheezes or crackles. Heart: Regular rate and rhythm without murmurs, rubs, or gallops. Abdomen: Soft, nontender, nondistended. Extremities: No lower extremity edema.  ASSESSMENT AND PLAN: .   Palpitations, PSVT, NSVT, and atypical chest pain: Symptoms are stable to slightly improved from her last visit.  We will continue propranolol, which is also being used for essential tremor.  No further workup at this time.  Aortic atherosclerosis and hyperlipidemia: Aortic atherosclerosis noted on prior cross-sectional imaging of the chest.  Most recent LDL earlier this year was mildly elevated at 122.  Andrew [name] 10-year ASCVD risk score is 21%.  We discussed the risks and benefits of statin therapy.  Andrew [name] wishes to defer statin therapy for the time being keep working on lifestyle modifications.    Dispo: Return to clinic in 1 year.  Signed, Yvonne Kendall, MD

## 2023-08-06 DIAGNOSIS — Z23 Encounter for immunization: Secondary | ICD-10-CM | POA: Diagnosis not present

## 2023-08-09 ENCOUNTER — Ambulatory Visit: Payer: Medicare Other | Admitting: Dermatology

## 2023-08-09 DIAGNOSIS — Z5111 Encounter for antineoplastic chemotherapy: Secondary | ICD-10-CM | POA: Diagnosis not present

## 2023-08-09 DIAGNOSIS — Z1283 Encounter for screening for malignant neoplasm of skin: Secondary | ICD-10-CM

## 2023-08-09 DIAGNOSIS — Z85828 Personal history of other malignant neoplasm of skin: Secondary | ICD-10-CM | POA: Diagnosis not present

## 2023-08-09 DIAGNOSIS — L821 Other seborrheic keratosis: Secondary | ICD-10-CM | POA: Diagnosis not present

## 2023-08-09 DIAGNOSIS — L570 Actinic keratosis: Secondary | ICD-10-CM | POA: Diagnosis not present

## 2023-08-09 DIAGNOSIS — W908XXA Exposure to other nonionizing radiation, initial encounter: Secondary | ICD-10-CM | POA: Diagnosis not present

## 2023-08-09 DIAGNOSIS — L578 Other skin changes due to chronic exposure to nonionizing radiation: Secondary | ICD-10-CM

## 2023-08-09 DIAGNOSIS — L814 Other melanin hyperpigmentation: Secondary | ICD-10-CM | POA: Diagnosis not present

## 2023-08-09 DIAGNOSIS — Z7189 Other specified counseling: Secondary | ICD-10-CM

## 2023-08-09 DIAGNOSIS — Z872 Personal history of diseases of the skin and subcutaneous tissue: Secondary | ICD-10-CM | POA: Diagnosis not present

## 2023-08-09 DIAGNOSIS — D1801 Hemangioma of skin and subcutaneous tissue: Secondary | ICD-10-CM | POA: Diagnosis not present

## 2023-08-09 DIAGNOSIS — L820 Inflamed seborrheic keratosis: Secondary | ICD-10-CM

## 2023-08-09 DIAGNOSIS — D229 Melanocytic nevi, unspecified: Secondary | ICD-10-CM

## 2023-08-09 DIAGNOSIS — L910 Hypertrophic scar: Secondary | ICD-10-CM

## 2023-08-09 NOTE — Progress Notes (Signed)
Follow-Up Visit   Subjective  Andrew [name] is a 77 y.o. male who presents for the following: Skin Cancer Screening and Full Body Skin Exam  The patient presents for Total-Body Skin Exam (TBSE) for skin cancer screening and mole check. The patient has spots, moles and lesions to be evaluated, some may be new or changing and the patient may have concern these could be cancer.  Patient with hx of BCC, AK's. Patient c/o possible recurrence at left ear, previously had a BCC in that area. Patient had a bug bite at left lower leg and now has a red bump there. Patient did use 5FU/calcipotriene at scalp with good result.  The following portions of the chart were reviewed this encounter and updated as appropriate: medications, allergies, medical history  Review of Systems:  No other skin or systemic complaints except as noted in HPI or Assessment and Plan.  Objective  Well appearing patient in no apparent distress; mood and affect are within normal limits.  A full examination was performed including scalp, head, eyes, ears, nose, lips, neck, chest, axillae, abdomen, back, buttocks, bilateral upper extremities, bilateral lower extremities, hands, feet, fingers, toes, fingernails, and toenails. All findings within normal limits unless otherwise noted below.   Relevant physical exam findings are noted in the Assessment and Plan.  L post crown x 6, L eyebrow x 1, R upper antihelix x 1 (8) Erythematous thin papules/macules with gritty scale.   L upper clavicle x 1, L post auricular x 1 (2) Erythematous stuck-on, waxy papule or plaque    Assessment & Plan   SKIN CANCER SCREENING PERFORMED TODAY.  ACTINIC DAMAGE WITH PRECANCEROUS ACTINIC KERATOSES Counseling for Topical Chemotherapy Management: Patient exhibits: - Severe, confluent actinic changes with pre-cancerous actinic keratoses that is secondary to cumulative UV radiation exposure over time - Condition that is severe; chronic, not at  goal. - diffuse scaly erythematous macules and papules with underlying dyspigmentation - Discussed Prescription "Field Treatment" topical Chemotherapy for Severe, Chronic Confluent Actinic Changes with Pre-Cancerous Actinic Keratoses Field treatment involves treatment of an entire area of skin that has confluent Actinic Changes (Sun/ Ultraviolet light damage) and PreCancerous Actinic Keratoses by method of PhotoDynamic Therapy (PDT) and/or prescription Topical Chemotherapy agents such as 5-fluorouracil, 5-fluorouracil/calcipotriene, and/or imiquimod.  The purpose is to decrease the number of clinically evident and subclinical PreCancerous lesions to prevent progression to development of skin cancer by chemically destroying early precancer changes that may or may not be visible.  It has been shown to reduce the risk of developing skin cancer in the treated area. As a result of treatment, redness, scaling, crusting, and open sores may occur during treatment course. One or more than one of these methods may be used and may have to be used several times to control, suppress and eliminate the PreCancerous changes. Discussed treatment course, expected reaction, and possible side effects. - Recommend daily broad spectrum sunscreen SPF 30+ to sun-exposed areas, reapply every 2 hours as needed.  - Staying in the shade or wearing long sleeves, sun glasses (UVA+UVB protection) and wide brim hats (4-inch brim around the entire circumference of the hat) are also recommended. - Call for new or changing lesions. - Start 5-fluorouracil/calcipotriene cream twice a day for 7-10 days to affected areas including scalp, areas at forehead and temples for 5-7 days. Patient has prescription. Patient provided with handout reviewing treatment course and side effects and advised to call or message [place] on MyChart with any concerns.   LENTIGINES,  SEBORRHEIC KERATOSES, HEMANGIOMAS - Benign normal skin lesions - Benign-appearing - Call  for any changes  MELANOCYTIC NEVI - Tan-brown and/or pink-flesh-colored symmetric macules and papules - Benign appearing on exam today - Observation - Call clinic for new or changing moles - Recommend daily use of broad spectrum spf 30+ sunscreen to sun-exposed areas.   History of Basal Cell Carcinoma of the Skin - No evidence of recurrence today of the R upper ear helix, R temple - Recommend regular full body skin exams - Recommend daily broad spectrum sunscreen SPF 30+ to sun-exposed areas, reapply every 2 hours as needed.  - Call if any new or changing lesions are noted between office visits    AK (actinic keratosis) (8) L post crown x 6, L eyebrow x 1, R upper antihelix x 1  Actinic keratoses are precancerous spots that appear secondary to cumulative UV radiation exposure/sun exposure over time. They are chronic with expected duration over 1 year. A portion of actinic keratoses will progress to squamous cell carcinoma of the skin. It is not possible to reliably predict which spots will progress to skin cancer and so treatment is recommended to prevent development of skin cancer.  Recommend daily broad spectrum sunscreen SPF 30+ to sun-exposed areas, reapply every 2 hours as needed.  Recommend staying in the shade or wearing long sleeves, sun glasses (UVA+UVB protection) and wide brim hats (4-inch brim around the entire circumference of the hat). Call for new or changing lesions.  Destruction of lesion - L post crown x 6, L eyebrow x 1, R upper antihelix x 1 (8)  Destruction method: cryotherapy   Informed consent: discussed and consent obtained   Lesion destroyed using liquid nitrogen: Yes   Region frozen until ice ball extended beyond lesion: Yes   Outcome: patient tolerated procedure well with no complications   Post-procedure details: wound care instructions given   Additional details:  Prior to procedure, discussed risks of blister formation, small wound, skin  dyspigmentation, or rare scar following cryotherapy. Recommend Vaseline ointment to treated areas while healing.   Inflamed seborrheic keratosis (2) L upper clavicle x 1, L post auricular x 1  Symptomatic, irritating, patient would like treated.  Benign-appearing.  Call clinic for new or changing lesions.    Destruction of lesion - L upper clavicle x 1, L post auricular x 1 (2)  Destruction method: cryotherapy   Informed consent: discussed and consent obtained   Lesion destroyed using liquid nitrogen: Yes   Region frozen until ice ball extended beyond lesion: Yes   Outcome: patient tolerated procedure well with no complications   Post-procedure details: wound care instructions given   Additional details:  Prior to procedure, discussed risks of blister formation, small wound, skin dyspigmentation, or rare scar following cryotherapy. Recommend Vaseline ointment to treated areas while healing.   HYPERTROPHIC SCAR Left medial lower leg Slightly firm violaceous thin papule  Benign, observe.    Return in about 1 year (around 08/08/2024) for TBSE, with Dr. Roseanne Reno, Hx BCC, Hx AK.  Anise Salvo, RMA, am acting as scribe for Willeen Niece, MD .   Documentation: I have reviewed the above documentation for accuracy and completeness, and I agree with the above.  Willeen Niece, MD

## 2023-08-09 NOTE — Patient Instructions (Addendum)
- Start 5-fluorouracil/calcipotriene cream twice a day for 7-10 days to affected areas including scalp, areas at forehead and temples for 5-7 days. Patient has prescription. Patient provided with handout reviewing treatment course and side effects and advised to call or message [place] on MyChart with any  concerns.   5-Fluorouracil/Calcipotriene Patient Education   Actinic keratoses are the dry, red scaly spots on the skin caused by sun damage. A portion of these spots can turn into skin cancer with time, and treating them can help prevent development of skin cancer.   Treatment of these spots requires removal of the defective skin cells. There are various ways to remove actinic keratoses, including freezing with liquid nitrogen, treatment with creams, or treatment with a blue light procedure in the office.   5-fluorouracil cream is a topical cream used to treat actinic keratoses. It works by interfering with the growth of abnormal fast-growing skin cells, such as actinic keratoses. These cells peel off and are replaced by healthy ones. THIS CREAM SHOULD BE KEPT OUT OF REACH OF CHILDREN AND PETS AND SHOULD NOT BE USED BY PREGNANT WOMEN.  5-fluorouracil/calcipotriene is a combination of the 5-fluorouracil cream with a vitamin D analog cream called calcipotriene. The calcipotriene alone does not treat actinic keratoses. However, when it is combined with 5-fluorouracil, it helps the 5-fluorouracil treat the actinic keratoses much faster so that the same results can be achieved with a much shorter treatment time.  INSTRUCTIONS FOR 5-FLUOROURACIL/CALCIPOTRIENE CREAM:   5-fluorouracil/calcipotriene cream typically only needs to be used for 4-7 days. A thin layer should be applied twice a day to the treatment areas recommended by your physician.   If your physician prescribed you separate tubes of 5-fluourouracil and calcipotriene, apply a thin layer of 5-fluorouracil followed by a thin layer of calcipotriene.    Avoid contact with your eyes or nostrils. Avoid applying the cream to your eyelids or lips unless directed to apply there by your physician. Do not use 5-fluorouracil/calcipotriene cream on infected or open wounds.   You will develop redness, irritation and some crusting at areas where you have pre-cancer damage/actinic keratoses. IF YOU DEVELOP PAIN, BLEEDING, OR SIGNIFICANT CRUSTING, STOP THE TREATMENT EARLY - you have already gotten a good response and the actinic keratoses should clear up well.  Wash your hands after applying 5-fluorouracil 5% cream on your skin.   A moisturizer or sunscreen with a minimum SPF 30 should be applied each morning.   Once you have finished the treatment, you can apply a thin layer of Vaseline twice a day to irritated areas to soothe and calm the areas more quickly. If you experience significant discomfort, contact your physician.  For some patients it is necessary to repeat the treatment for best results.  SIDE EFFECTS: When using 5-fluorouracil/calcipotriene cream, you may have mild irritation, such as redness, dryness, swelling, or a mild burning sensation. This usually resolves within 2 weeks. The more actinic keratoses you have, the more redness and inflammation you can expect during treatment. Eye irritation has been reported rarely. If this occurs, please let us know.   If you have any trouble using this cream, please send [place] a MyChart message or call the office. If you have any other questions about this information, please do not hesitate to ask me before you leave the office or contact me on MyChart or by phone.  Cryotherapy Aftercare  Wash gently with soap and water everyday.   Apply Vaseline and Band-Aid daily until healed.  Melanoma ABCDEs  Melanoma is the most dangerous type of skin cancer, and is the leading cause of death from skin disease.  You are more likely to develop melanoma if you: Have light-colored skin, light-colored eyes, or  red or blond hair Spend a lot of time in the sun Tan regularly, either outdoors or in a tanning bed Have had blistering sunburns, especially during childhood Have a close family member who has had a melanoma Have atypical moles or large birthmarks  Early detection of melanoma is key since treatment is typically straightforward and cure rates are extremely high if we catch it early.   The first sign of melanoma is often a change in a mole or a new dark spot.  The ABCDE system is a way of remembering the signs of melanoma.  A for asymmetry:  The two halves do not match. B for border:  The edges of the growth are irregular. C for color:  A mixture of colors are present instead of an even brown color. D for diameter:  Melanomas are usually (but not always) greater than 6mm - the size of a pencil eraser. E for evolution:  The spot keeps changing in size, shape, and color.  Please check your skin once per month between visits. You can use a small mirror in front and a large mirror behind you to keep an eye on the back side or your body.   If you see any new or changing lesions before your next follow-up, please call to schedule a visit.  Please continue daily skin protection including broad spectrum sunscreen SPF 30+ to sun-exposed areas, reapplying every 2 hours as needed when you're outdoors.    Due to recent changes in healthcare laws, you may see results of your pathology and/or laboratory studies on MyChart before the doctors have had a chance to review them. We understand that in some cases there may be results that are confusing or concerning to you. Please understand that not all results are received at the same time and often the doctors may need to interpret multiple results in order to provide you with the best plan of care or course of treatment. Therefore, we ask that you please give [place] 2 business days to thoroughly review all your results before contacting the office for clarification.  Should we see a critical lab result, you will be contacted sooner.   If You Need Anything After Your Visit  If you have any questions or concerns for your doctor, please call our main line at [phone] and press option 4 to reach your doctor's medical assistant. If no one answers, please leave a voicemail as directed and we will return your call as soon as possible. Messages left after 4 pm will be answered the following business day.   You may also send [place] a message via MyChart. We typically respond to MyChart messages within 1-2 business days.  For prescription refills, please ask your pharmacy to contact our office. Our fax number is [phone].  If you have an urgent issue when the clinic is closed that cannot wait until the next business day, you can page your doctor at the number below.    Please note that while we do our best to be available for urgent issues outside of office hours, we are not available 24/7.   If you have an urgent issue and are unable to reach [place], you may choose to seek medical care at your doctor's office, retail clinic, urgent  care center, or emergency room.  If you have a medical emergency, please immediately call 911 or go to the emergency department.  Pager Numbers  - Dr. Gwen Pounds: [phone]  - Dr. Roseanne Reno: [phone]  - Dr. Katrinka Blazing: [phone]   In the event of inclement weather, please call our main line at [phone] for an update on the status of any delays or closures.  Dermatology Medication Tips: Please keep the boxes that topical medications come in in order to help keep track of the instructions about where and how to use these. Pharmacies typically print the medication instructions only on the boxes and not directly on the medication tubes.   If your medication is too expensive, please contact our office at [phone] option 4 or send [place] a message through MyChart.   We are unable to tell what your co-pay for medications will be  in advance as this is different depending on your insurance coverage. However, we may be able to find a substitute medication at lower cost or fill out paperwork to get insurance to cover a needed medication.   If a prior authorization is required to get your medication covered by your insurance company, please allow [place] 1-2 business days to complete this process.  Drug prices often vary depending on where the prescription is filled and some pharmacies may offer cheaper prices.  The website www.goodrx.com contains coupons for medications through different pharmacies. The prices here do not account for what the cost may be with help from insurance (it may be cheaper with your insurance), but the website can give you the price if you did not use any insurance.  - You can print the associated coupon and take it with your prescription to the pharmacy.  - You may also stop by our office during regular business hours and pick up a GoodRx coupon card.  - If you need your prescription sent electronically to a different pharmacy, notify our office through [facility] or by phone at [phone] option 4.

## 2023-09-27 DIAGNOSIS — Z23 Encounter for immunization: Secondary | ICD-10-CM | POA: Diagnosis not present

## 2024-03-24 ENCOUNTER — Other Ambulatory Visit: Payer: Self-pay | Admitting: Internal Medicine

## 2024-05-28 DIAGNOSIS — H2513 Age-related nuclear cataract, bilateral: Secondary | ICD-10-CM | POA: Diagnosis not present

## 2024-05-28 DIAGNOSIS — H43819 Vitreous degeneration, unspecified eye: Secondary | ICD-10-CM | POA: Diagnosis not present

## 2024-05-28 DIAGNOSIS — H04123 Dry eye syndrome of bilateral lacrimal glands: Secondary | ICD-10-CM | POA: Diagnosis not present

## 2024-06-18 ENCOUNTER — Telehealth: Payer: Self-pay

## 2024-06-18 NOTE — Telephone Encounter (Signed)
 Copied from CRM [id number]. Topic: Clinical - Medication Question >> Jun 18, 2024 10:06 AM [place] A wrote: Reason for CRM: Patient wife called would like prescription for Covid vaccine-please contact

## 2024-06-22 MED ORDER — COVID-19 MRNA VACC (MODERNA) 50 MCG/0.5ML IM SUSY: 0.5000 mL | PREFILLED_SYRINGE | Freq: Once | INTRAMUSCULAR | 0 refills | Status: AC

## 2024-06-22 NOTE — Telephone Encounter (Unsigned)
 Copied from CRM #[id number]. Topic: Clinical - Medication Question >> Jun 22, 2024  9:13 AM Tiffini S wrote: Reason for CRM:  Patient spouse called back to request a prescription be sent for the COVID vaccine to:  [id number] - [place], [place]  [address] AT [facility] [address] [place] [address] Phone: [phone] Fax: [phone]  Patient spouse asked that a message is sent in [facility] to discuss when the request will be sent to the pharmacy.

## 2024-06-22 NOTE — Addendum Note (Signed)
 Addended by: Takiera Mayo E on: 06/22/2024 10:01 AM   Modules accepted: Orders

## 2024-06-27 DIAGNOSIS — Z23 Encounter for immunization: Secondary | ICD-10-CM | POA: Diagnosis not present

## 2024-08-21 ENCOUNTER — Ambulatory Visit: Payer: Medicare Other | Admitting: Dermatology

## 2024-08-23 ENCOUNTER — Other Ambulatory Visit: Payer: Self-pay

## 2024-08-23 ENCOUNTER — Emergency Department
Admission: EM | Admit: 2024-08-23 | Discharge: 2024-08-23 | Disposition: A | Discharge status: Home / Self Care | Attending: Emergency Medicine | Admitting: Emergency Medicine

## 2024-08-23 ENCOUNTER — Ambulatory Visit: Admitting: Dermatology

## 2024-08-23 ENCOUNTER — Encounter: Payer: Self-pay | Admitting: Emergency Medicine

## 2024-08-23 ENCOUNTER — Emergency Department

## 2024-08-23 DIAGNOSIS — R0789 Other chest pain: Secondary | ICD-10-CM | POA: Diagnosis not present

## 2024-08-23 DIAGNOSIS — I10 Essential (primary) hypertension: Secondary | ICD-10-CM | POA: Diagnosis not present

## 2024-08-23 DIAGNOSIS — K449 Diaphragmatic hernia without obstruction or gangrene: Secondary | ICD-10-CM | POA: Diagnosis not present

## 2024-08-23 DIAGNOSIS — R079 Chest pain, unspecified: Secondary | ICD-10-CM | POA: Diagnosis not present

## 2024-08-23 LAB — BASIC METABOLIC PANEL WITH GFR
Anion gap: 8 (ref 5–15)
BUN: 17 mg/dL (ref 8–23)
CO2: 28 mmol/L (ref 22–32)
Calcium: 8.6 mg/dL — ABNORMAL LOW (ref 8.9–10.3)
Chloride: 103 mmol/L (ref 98–111)
Creatinine, Ser: 0.87 mg/dL (ref 0.61–1.24)
GFR, Estimated: >60 mL/min (ref >60)
Glucose, Bld: 135 mg/dL — ABNORMAL HIGH (ref 70–99)
Potassium: 4.4 mmol/L (ref 3.5–5.1)
Sodium: 139 mmol/L (ref 135–145)

## 2024-08-23 LAB — CBC
HCT: 41 % (ref 39.0–52.0)
Hemoglobin: 13.7 g/dL (ref 13.0–17.0)
MCH: 31.8 pg (ref 26.0–34.0)
MCHC: 33.4 g/dL (ref 30.0–36.0)
MCV: 95.1 fL (ref 80.0–100.0)
Platelets: 121 K/uL — ABNORMAL LOW (ref 150–400)
RBC: 4.31 MIL/uL (ref 4.22–5.81)
RDW: 11.5 % (ref 11.5–15.5)
WBC: 4.8 K/uL (ref 4.0–10.5)
nRBC: 0 % (ref 0.0–0.2)

## 2024-08-23 LAB — PROTIME-INR
INR: 1 (ref 0.8–1.2)
Prothrombin Time: 13.8 s (ref 11.4–15.2)

## 2024-08-23 LAB — TROPONIN T, HIGH SENSITIVITY
Troponin T High Sensitivity: <15 ng/L (ref 0–19)
Troponin T High Sensitivity: <15 ng/L (ref 0–19)

## 2024-08-23 LAB — D-DIMER, QUANTITATIVE: D-Dimer, Quant: 0.47 ug{FEU}/mL (ref 0.00–0.50)

## 2024-08-23 NOTE — ED Notes (Signed)
 Called CCMD to initiate cardiac monitoring.

## 2024-08-23 NOTE — ED Provider Notes (Signed)
 [facility] Provider Note    Event Date/Time   First MD Initiated Contact with Patient 08/23/24 0830     (approximate)   History   Chest Pain   HPI  Andrew [name] is a 78 y.o. male history of cardiac disease including supraventricular tachycardia, nonsustained V. tach, atypical chest pain with normal coronary CTA (02/2020), palpitations with monitor showing brief runs of PSVT and NSVT, aortic atherosclerosis   Patient woke up sometime around 5 to 6 AM, and having discomfort in his left chest he reports it to be slightly sharp.  It bothered him and made him concerned she has had a history of similar pain in the past.  He usually walks somewhere around 1 to 3 miles a day does not experience any chest pain at all with that.  In the past that he had sort of similar episodes of just like twinges or sharp discomfort in the left mid chest that will come and go.  He denies any history of blood clots.  He does have a history of some heart disease including arrhythmias detected on monitoring where he was feeling he was having episodes of panic attack and palpitations.  He has not experienced any of those symptoms today.  Presently his pain and symptoms are gone.  He took 2 aspirin  prior to coming and reports all symptoms now gone.  He said no recent illness no cough no fevers.  Reports he is extremely healthy.  No leg swelling.  Patient took 2 full-strength aspirin  this morning   Past Medical History:  Diagnosis Date   Actinic keratosis    Aortic atherosclerosis 02/15/2020   on CT scan   Atypical chest pain    a. Rest pain only; b. 08/2016 ETT: Ex time , no ST/T changes ->nl study.   Basal cell carcinoma 08/11/2015   Right temple. Superficial and nodular patterns. Excised 09/02/2015, margins free.   Basal cell carcinoma 05/16/2018   Right upper third ear helix. Nodular pattern. txd with EDC   Headache    H/O MIGRAINES   History of anxiety    Hypertension     NSVT (nonsustained ventricular tachycardia) (HCC)    a. 03/2017 Zio Monitor: 2 episodes of NSVT - up to 7 beats, max HR 207.   Palpitations    a. 03/2017 14 day Zio Monitor: PACs, PVCs, brief PSVT and NSVT-->managed w/ beta blocker.   Precancerous lesion    PSVT (paroxysmal supraventricular tachycardia)    a. 03/2017  Zio monitor: 7 episodes of PSVT - up to 6 beats, max rate 193.   Valvular heart disease    a. 06/2017 Echo: EF 60-65%. No rwma. Mild MR. Mild to mod TR. PASP .     Physical Exam   Triage Vital Signs: ED Triage Vitals  Encounter Vitals Group     BP 08/23/24 0822 (!) 149/86     Girls Systolic BP Percentile --      Girls Diastolic BP Percentile --      Boys Systolic BP Percentile --      Boys Diastolic BP Percentile --      Pulse Rate 08/23/24 0822 68     Resp 08/23/24 0822 18     Temp 08/23/24 0822 97.8 F (36.6 C)     Temp Source 08/23/24 0822 Oral     SpO2 08/23/24 0822 98 %     Weight 08/23/24 0820 150 lb (68 kg)     Height 08/23/24 0820 6' (  1.829 m)     Head Circumference --      Peak Flow --      Pain Score 08/23/24 0820 0     Pain Loc --      Pain Education --      Exclude from Growth Chart --     Most recent vital signs: Vitals:   08/23/24 0900 08/23/24 0930  BP: 117/66 107/64  Pulse: 62 63  Resp: 10 14  Temp:    SpO2: 100% 100%     General: Awake, no distress.  CV:  Good peripheral perfusion.  Normal tones and rate Resp:  Normal effort.  Clear bilateral Abd:  No distention.  Soft nontender not Other:  Lower extremity edema venous cords or congestion.  No calf tenderness.   Patient laid flat in the bed and asked to take deep breaths.  This does not reproduce or cause any pain at this time either.  Abdomen soft nontender nondistended throughout.  Denies any abdominal pain no nausea vomiting  ED Results / Procedures / Treatments   Labs (all labs ordered are listed, but only abnormal results are displayed) Labs Reviewed  BASIC  METABOLIC PANEL WITH GFR - Abnormal; Notable for the following components:      Result Value   Glucose, Bld 135 (*)    Calcium 8.6 (*)    All other components within normal limits  CBC - Abnormal; Notable for the following components:   Platelets 121 (*)    All other components within normal limits  PROTIME-INR  D-DIMER, QUANTITATIVE  TROPONIN T, HIGH SENSITIVITY  TROPONIN T, HIGH SENSITIVITY     EKG  ECG at 915 interpreted heart rate 65 QRS 90 QTc 440 Normal sinus rhythm, slight J-point elevation in minimally peaked appearance of the T waves.  No STEMI.  Slight artifact  EKG inter by me at 8:30 AM heart rate 70 QRS 100 QTc 400 Slight peaked appearance with upward sloping T waves noted in inferior and lateral precordial leads.  Compared to prior does appear to have slight lead more hyperacute appearance of EKG of leads.  Unclear as to exact etiology.  Does not meet STEMI criteria.  May be indicative of acute obstructive MI or early MI, but at this point no STEMI apparent and the patient is now also chest pain-free  Repeat EKG provided at 8:40 AM interpreted by me as heart rate 65 QRS 110 QTc 410 Sinus rhythm, less artifact than previous I do not see evidence of ST elevation in the leads at this time, slight repolarization like abnormality noted across the precordial leads.    RADIOLOGY  Chest x-ray inter by me as clear negative for acute process  DG Chest 1 View Result Date: 08/23/2024 CLINICAL DATA:  Chest pain EXAM: CHEST  1 VIEW COMPARISON:  12/13/2068 FINDINGS: Top-normal heart size. Moderate-sized hiatal hernia. There is no evidence of pulmonary edema, consolidation, pneumothorax or pleural fluid. The visualized skeletal structures are unremarkable. IMPRESSION: No acute findings. Moderate-sized hiatal hernia. Electronically Signed   By: Marcey Moan M.D.   On: 08/23/2024 09:56      PROCEDURES:  Critical Care performed: No  Procedures   MEDICATIONS ORDERED IN  ED: Medications - No data to display   IMPRESSION / MDM / ASSESSMENT AND PLAN / ED COURSE  I reviewed the triage vital signs and the nursing notes.  Differential diagnosis includes, but is not limited to, ACS, aortic dissection, pulmonary embolism, cardiac tamponade, pneumothorax, pneumonia, pericarditis, myocarditis, GI-related causes including esophagitis/gastritis, and musculoskeletal chest wall pain.    Patient with pleuritic pain earlier this morning.  Initial heart tracings reviewed multiple ECGs reviewed and had first concern for possible hyperacute T waves or potential MI but he is now pain-free and his initial troponin is normal.  He does not demonstrate progression of any acute ST abnormality to suggest STEMI on multiple ECGs and is now pain and symptom-free.  Alternative considerations such as mild pleurisy, pericarditis, etc. becoming more suspect.  Will obtain second troponin also normal, in the setting of his symptoms being very atypical without heavy chest pressure radiation etc. I plan to discuss with his [facility] cardiology team.  Low pretest probability for PE by history and exam.  D-dimer negative, exclusionary  Patient's presentation is most consistent with acute complicated illness / injury requiring diagnostic workup.   The patient is on the cardiac monitor to evaluate for evidence of arrhythmia and/or significant heart rate changes.   Clinical Course as of 08/23/24 1212  Thu Aug 23, 2024  0950 CBC normal.  Metabolic panel without acute concerning departures.  Initial troponin normal [MQ]  1138 D-dimer negative.  Exclusionary for PE [MQ]  1142 Sent message to Dr. Gollan, he is currently reviewing.  Requested he review patient clinical history, ECGs from today.  Thus far quite reassuring that the patient's symptoms have abated and he is pain-free.  Troponin normal x 2 [MQ]  1147 Patient pain and symptom-free.  Ambulatory in the hallway  back-and-forth to the bathroom with no concern.  States feeling well no further discomfort.  I also reviewed he has a known moderate hiatal hernia, I suppose it could cause some occasional symptoms but he has no persistent discomfort or pain no GI symptoms.  No abdominal pain on exam.  No vomiting.  And his pain is fully relieved. [MQ]  1147 Atypical chest pain now resolved. [MQ]  1147 Per Dr Perla EKG looks unchanged from prior, enzymes negative, history of atypical chest pain dating back several years, had a cardiac CTA several years ago with no buildup, not even any calcium buildup.  He could probably follow-up in clinic [MQ]    Clinical Course User Index [MQ] Dicky Anes, MD   ----------------------------------------- 12:07 PM on 08/23/2024 -----------------------------------------  Patient comfortable.  No pain or discomfort.  Comfortable with plan to follow-up with cardiology.  Also discussed with him and not certain perhaps his symptoms could somehow be related to his hiatal hernia though that is not certain.  Pain seems atypical.  Low risk for ACS at this juncture.  Will be following up with primary care and cardiology  Return precautions and treatment recommendations and follow-up discussed with the patient who is agreeable with the plan.   FINAL CLINICAL IMPRESSION(S) / ED DIAGNOSES   Final diagnoses:  Atypical chest pain     Rx / DC Orders   ED Discharge Orders          Ordered    Ambulatory referral to Cardiology        08/23/24 1148             Note:  This document was prepared using Dragon voice recognition software and may include unintentional dictation errors.   Dicky Anes, MD 08/23/24 1212

## 2024-08-23 NOTE — Discharge Instructions (Addendum)

## 2024-08-23 NOTE — ED Triage Notes (Addendum)
 Pt via POV from home. Pt c/o L sided intermittent, non-radiating, sharp CP that started this AM when he woke up. Denies any SOB or NV. Denies any blood thinners. Reports he did 2 325 mg ASA this AM. Denies any pain at this time. Denies any MI in the past. Pt is A&Ox4 and NAD, ambulatory to triage

## 2024-08-27 ENCOUNTER — Telehealth: Payer: Self-pay | Admitting: Internal Medicine

## 2024-08-27 DIAGNOSIS — Z79899 Other long term (current) drug therapy: Secondary | ICD-10-CM

## 2024-08-27 DIAGNOSIS — E7800 Pure hypercholesterolemia, unspecified: Secondary | ICD-10-CM

## 2024-08-27 NOTE — Telephone Encounter (Signed)
 Pts wife asking if orders for lab work can be put in before his appt tomorrow 11/18. Please advise.

## 2024-08-27 NOTE — Telephone Encounter (Signed)
Duplicate see other note

## 2024-08-27 NOTE — Progress Notes (Unsigned)
 Cardiology Office Note    Date:  08/28/2024   ID:  Andrew [name], DOB 18-Jan-1946, MRN [id number]  PCP:  Donzella Lauraine SAILOR, DO  Cardiologist:  Lonni Hanson, MD  Electrophysiologist:  None   Chief Complaint: ED follow-up.  History of Present Illness:   Andrew [name] is a 78 y.o. male with history of atypical chest pain with normal coronary arteries by coronary CTA in 02/2020, palpitations with brief runs of PSVT and NSVT, aortic atherosclerosis, hiatal hernia, and anxiety who presents for ED follow-up of chest pain.  Echo in 2013 showed an EF of 55 to 60%, no regional wall motion abnormalities, mild mitral regurgitation, and normal RVSP.  ETT in 08/2016 was normal and demonstrated adequate exercise tolerance.  Zio patch in 2018 showed a predominant rhythm of sinus with rare atrial and ventricular ectopy as well as 7 episodes of SVT lasting up to 6 beats and 2 episodes of NSVT lasting up to 7 beats.  Patient triggered events corresponded to sinus rhythm.  Echo in 2018 showed an EF of 60 to 65%, normal wall motion, normal LV diastolic function parameters, mild mitral regurgitation, normal RV systolic function, mild to moderate tricuspid regurgitation, and normal RVSP.  Neri CTA in 02/2020 showed a calcium score of 0 with no evidence of CAD.  Noncardiac overread showed aortic atherosclerosis and a moderate-sized hiatal hernia.  In follow-up visits, he has continued to note random episodes of atypical chest pain.  He was seen in the ED on 08/23/2024 with left-sided chest discomfort.  He reported typically walking 1 to 3 miles per day without anginal symptoms.  He was asymptomatic in the ER.  EKG showed sinus rhythm with incomplete RBBB with repolarization abnormality.  High-sensitivity troponin negative x 2 dimer normal.  Chest x-ray with without acute findings with a moderate-sized hiatal hernia noted.  He remained asymptomatic in the ER.  Outpatient follow-up was recommended.  He comes in  accompanied by his wife today and is currently without symptoms of angina or cardiac decompensation.  He reports being woken up on the morning of 08/23/2024 with onset of dull left-sided chest discomfort described as a muscle cramp that would last several seconds in duration and spontaneously resolved.  He continued to have intermittent episodes without associated dyspnea, nausea, vomiting, palpitations, dizziness, near-syncope, or syncope.  He subsequently presented to the ER with reassuring workup as outlined above.  Since that time, he has continued to have intermittent episodes of left-sided anterior chest cramping that are brief in duration and not associated with exertion.  He reports feeling anxious after his younger brother passed away earlier this year, details of his passing are unclear.  He does notice some bilateral lower extremity sock indentation.  No progressive orthopnea.  Remains active at baseline without onset of exertional dyspnea or chest pain.  He also reports not sleeping well and will have episodes of hypnagogic jerks.   Labs independently reviewed: 08/2024 - high-sensitivity troponin negative x 2, D-dimer negative, Hgb 13.7, PLT 121, potassium 4.4, BUN 17, serum creatinine 0.87, TC 176, TG 51, HDL 51, LDL 115, LP(a) pending 04/2023 - TSH 0.297, free T4 normal, TC 184, TG 52, HDL 52, LDL 122, albumin 1, AST/ALT normal 04/2022 - A1c 5.4  Past Medical History:  Diagnosis Date   Actinic keratosis    Aortic atherosclerosis 02/15/2020   on CT scan   Atypical chest pain    a. Rest pain only; b. 08/2016 ETT: Ex time , no ST/T  changes ->nl study.   Basal cell carcinoma 08/11/2015   Right temple. Superficial and nodular patterns. Excised 09/02/2015, margins free.   Basal cell carcinoma 05/16/2018   Right upper third ear helix. Nodular pattern. txd with EDC   Headache    H/O MIGRAINES   History of anxiety    Hypertension    NSVT (nonsustained ventricular tachycardia) (HCC)     a. 03/2017 Zio Monitor: 2 episodes of NSVT - up to 7 beats, max HR 207.   Palpitations    a. 03/2017 14 day Zio Monitor: PACs, PVCs, brief PSVT and NSVT-->managed w/ beta blocker.   Precancerous lesion    PSVT (paroxysmal supraventricular tachycardia)    a. 03/2017  Zio monitor: 7 episodes of PSVT - up to 6 beats, max rate 193.   Valvular heart disease    a. 06/2017 Echo: EF 60-65%. No rwma. Mild MR. Mild to mod TR. PASP .    Past Surgical History:  Procedure Laterality Date   EYE SURGERY Right    Done as a child   INGUINAL HERNIA REPAIR Right 03/20/2018   Large Ultrapro mesh.  Surgeon: Dessa Reyes ORN, MD;  Location: [facility] ORS;  Service: General;  Laterality: Right;   VASECTOMY     30 yrs ago?    Current Medications: Current Meds  Medication Sig   cholecalciferol (VITAMIN D) 1000 UNITS tablet Take 1,000 Units by mouth daily.   MAGNESIUM PO Take 1 tablet by mouth daily. occasionally   metoprolol  tartrate (LOPRESSOR ) 50 MG tablet TAKE 1 TABLET 2 HR PRIOR TO CARDIAC PROCEDURE   Multiple Vitamin (MULTI-VITAMIN DAILY) TABS Take 1 tablet by mouth daily.    propranolol  ER (INDERAL  LA) 80 MG 24 hr capsule TAKE 1 CAPSULE BY MOUTH EVERY DAY   saw palmetto 160 MG capsule Take 160 mg by mouth every other day.    Turmeric 450 MG CAPS Take 450 mg by mouth daily. occasionally    Allergies:   Patient has no known allergies.   Social History   Socioeconomic History   Marital status: Married    Spouse name: Not on file   Number of children: 0   Years of education: Not on file   Highest education level: Some college, no degree  Occupational History   Occupation: Retired  Tobacco Use   Smoking status: Never   Smokeless tobacco: Never  Vaping Use   Vaping status: Never Used  Substance and Sexual Activity   Alcohol use: Yes    Alcohol/week: 1.0 standard drink of alcohol    Types: 1 Glasses of wine per week    Comment: 1 glass of wine per week   Drug use: No   Sexual activity: Not  on file  Other Topics Concern   Not on file  Social History Narrative   Not on file   Social Drivers of Health   Financial Resource Strain: Low Risk  (05/27/2020)   Overall Financial Resource Strain (CARDIA)    Difficulty of Paying Living Expenses: Not hard at all  Food Insecurity: No Food Insecurity (05/27/2020)   Hunger Vital Sign    Worried About Running Out of Food in the Last Year: Never true    Ran Out of Food in the Last Year: Never true  Transportation Needs: No Transportation Needs (05/27/2020)   PRAPARE - Administrator, Civil Service (Medical): No    Lack of Transportation (Non-Medical): No  Physical Activity: Insufficiently Active (05/27/2020)   Exercise Vital Sign  Days of Exercise per Week: 1 day    Minutes of Exercise per Session: 40 min  Stress: No Stress Concern Present (05/27/2020)   [employer] of Occupational Health - Occupational Stress Questionnaire    Feeling of Stress : Only a little  Social Connections: Moderately Integrated (05/27/2020)   Social Connection and Isolation Panel    Frequency of Communication with Friends and Family: More than three times a week    Frequency of Social Gatherings with Friends and Family: More than three times a week    Attends Religious Services: Never    Database Administrator or Organizations: Yes    Attends Banker Meetings: Never    Marital Status: Married     Family History:  The patient's family history includes Heart attack in his father.  ROS:   12-point review of systems is negative unless otherwise noted in the HPI.   EKGs/Labs/Other Studies Reviewed:    Studies reviewed were summarized above. The additional studies were reviewed today:  Coronary CTA 02/14/2020: Aorta: Normal size. Minimal ascending aortic wall calcifications. No dissection.   Aortic Valve:  Trileaflet.  No calcifications.   Coronary Arteries:  Normal coronary origin.  Left dominance.   RCA is a large  nondominant artery giving rise to an RV branch. There is no plaque.   Left main is a large artery that gives rise to LAD and LCX arteries.   LAD is a large vessel that has no plaque.   LCX is a dominant artery that gives rise to one large OM1 branch and the PDA. There is no plaque.   Other findings:   Normal pulmonary vein drainage into the left atrium.   Normal left atrial appendage without a thrombus.   Normal size of the pulmonary artery.   IMPRESSION: 1. Coronary calcium score of 0. Patient is low risk for near term coronary events   2. Normal coronary origin with left dominance.   3. No evidence of CAD.   4. CAD-RADS 0. Consider non-atherosclerotic causes of chest pain. __________  2D echo 06/17/2017: - Left ventricle: The cavity size was normal. There was mild focal    basal hypertrophy of the septum. Systolic function was normal.    The estimated ejection fraction was in the range of 60% to 65%.    Wall motion was normal; there were no regional wall motion    abnormalities. Left ventricular diastolic function parameters    were normal.  - Mitral valve: There was mild regurgitation.  - Left atrium: The atrium was at the upper limits of normal in    size.  - Right ventricle: Systolic function was normal.  - Tricuspid valve: There was mild-moderate regurgitation.  - Pulmonary arteries: Systolic pressure was within the normal    range. PA peak pressure: 33 mm Hg (S).  __________  Zio patch 02/2017: The patient was monitored for 13 days, 23 hours. The predominant rhythm was sinus with an average rate of 70 bpm (range 47-139 bpm). There were rare PACs and occasional PVCs, including brief episodes of ventricular bigeminy and trigeminy. Seven episodes of paroxysmal supraventricular tachycardia lasting up to 6 beats with a maximal rate of 193 bpm were observed. There were two episodes of non-sustained ventricular tachycardia lasting up to 7 beats with a maximum rate of  207 bpm. No sustained arrhythmia or prolonged pause was identified. Patient triggered event corresponds to normal sinus rhythm.   Predominantly sinus rhythm with rare PACs and occasional PVCs.  Brief runs of PSVT and NSVT noted. __________  ETT 08/13/2016: Exercise stress test Resting EKG showing normal sinus rhythm, rate 97 bpm, no significant ST or T-wave changes Resting blood pressure 139/92 Regular Bruce protocol performed Total exercise time 4 minutes, target heart rate achieved 137 bpm, 90% of maximum predicted heart rate 5.9 METS PVCs noted No significant ST or T-wave changes at peak exercise or in recovery concerning for ischemia Heart rate down to 98 bpm at 3 minutes in recovery   Overall a normal exercise stress study Adequate exercise tolerance __________  2D echo 11/30/2011: - Left ventricle: The cavity size was normal. There was mild    focal basal hypertrophy of the septum. Systolic function    was normal. The estimated ejection fraction was in the    range of 55% to 60%. Wall motion was normal; there were no    regional wall motion abnormalities. Left ventricular    diastolic function parameters were normal.  - Mitral valve: Mild regurgitation.  - Left atrium: The atrium was normal in size.  - Pulmonary arteries: Systolic pressure was within the    normal range.  Impressions:   - Murmur could be secondarty to proximal septal thickening.    No [name] or significant outflow tract obstruction.    EKG:  EKG is ordered today.  The EKG ordered today demonstrates NSR, 65 bpm, no acute ST-T changes, consistent with prior tracing  Recent Labs: 08/23/2024: BUN 17; Creatinine, Ser 0.87; Hemoglobin 13.7; Platelets 121; Potassium 4.4; Sodium 139 08/28/2024: ALT 19  Recent Lipid Panel    Component Value Date/Time   CHOL 176 08/28/2024 0809   CHOL 184 04/28/2023 0826   TRIG 51 08/28/2024 0809   HDL 51 08/28/2024 0809   HDL 52 04/28/2023 0826   CHOLHDL 3.5 08/28/2024 0809    VLDL 10 08/28/2024 0809   LDLCALC 115 (H) 08/28/2024 0809   LDLCALC 122 (H) 04/28/2023 0826    PHYSICAL EXAM:    VS:  BP 115/60 (BP Location: Left Arm, Patient Position: Sitting, Cuff Size: Normal)   Pulse 65 Comment: 66 oximeter  Ht 6' (1.829 m)   Wt 160 lb (72.6 kg)   SpO2 98%   BMI 21.70 kg/m   BMI: Body mass index is 21.7 kg/m.  Physical Exam Vitals reviewed.  Constitutional:      Appearance: He is well-developed.  HENT:     Head: Normocephalic and atraumatic.  Eyes:     General:        Right eye: No discharge.        Left eye: No discharge.  Cardiovascular:     Rate and Rhythm: Normal rate and regular rhythm.     Pulses:          Posterior tibial pulses are 2+ on the right side and 2+ on the left side.     Heart sounds: Normal heart sounds, S1 normal and S2 normal. Heart sounds not distant. No midsystolic click and no opening snap. No murmur heard.    No friction rub.  Pulmonary:     Effort: Pulmonary effort is normal. No respiratory distress.     Breath sounds: Normal breath sounds. No decreased breath sounds, wheezing, rhonchi or rales.  Musculoskeletal:     Cervical back: Normal range of motion.     Comments: Mild bilateral sock indentations.  Skin:    General: Skin is warm and dry.     Nails: There is no clubbing.  Neurological:  Mental Status: He is alert and oriented to person, place, and time.  Psychiatric:        Speech: Speech normal.        Behavior: Behavior normal.        Thought Content: Thought content normal.        Judgment: Judgment normal.     Wt Readings from Last 3 Encounters:  08/28/24 160 lb (72.6 kg)  08/23/24 150 lb (68 kg)  07/29/23 154 lb (69.9 kg)     ASSESSMENT & PLAN:   Precordial pain: Currently without symptoms of angina or cardiac decompensation.  Coronary CTA in 2021 with a calcium score of 0 with no evidence of CAD.  Recent ER evaluation reassuring with nonacute EKG, negative high-sensitivity troponin x 2, and  normal D-dimer.  Obtain echo to evaluate for significant structural abnormality.  Schedule coronary CTA.  Aortic atherosclerosis/HLD: LDL 115 with normal AST/ALT.  His ASCVD 10-year risk score remains at 21%.  Further recommendations regarding potential escalation in pharmacotherapy pending coronary CTA.  Continue with heart healthy diet and active lifestyle.  Palpitations with brief nonsustained PSVT and NSVT: Quiescent.  Prior cardiac monitoring showed 7 episodes of PSVT lasting up to 6 beats and 2 episodes of NSVT lasting up to 7 beats.  Remains on propranolol  80 mg.   Disposition: F/u with Dr. Mady in 2 months.   Medication Adjustments/Labs and Tests Ordered: Current medicines are reviewed at length with the patient today.  Concerns regarding medicines are outlined above. Medication changes, Labs and Tests ordered today are summarized above and listed in the Patient Instructions accessible in Encounters.   Signed, Bernardino Bring, PA-C 08/28/2024 4:51 PM     [facility] [address] [place], [place] [address] [phone]

## 2024-08-27 NOTE — Telephone Encounter (Signed)
  The patient was experiencing chest pain and went to the ED on 08/23/24. His wife is concerned, and they are planning to travel to [place]  this weekend. They would like to make sure the patient is safe to travel. They have also scheduled his yearly follow-up appointment on 08/29/24 at 10:55 a.m. with Bernardino Bring, and she would like the patient to have an Lp(a) test done. They would like to know whether the test can be completed before the appointment and whether the patient needs to be fasting for it.

## 2024-08-28 ENCOUNTER — Ambulatory Visit: Payer: Self-pay | Admitting: Physician Assistant

## 2024-08-28 ENCOUNTER — Other Ambulatory Visit
Admission: RE | Admit: 2024-08-28 | Discharge: 2024-08-28 | Disposition: A | Discharge status: Home / Self Care | Attending: Physician Assistant | Admitting: Physician Assistant

## 2024-08-28 ENCOUNTER — Ambulatory Visit: Attending: Physician Assistant | Admitting: Physician Assistant

## 2024-08-28 ENCOUNTER — Encounter: Payer: Self-pay | Admitting: Physician Assistant

## 2024-08-28 VITALS — BP 115/60 | HR 65 | Ht 72.0 in | Wt 160.0 lb

## 2024-08-28 DIAGNOSIS — R072 Precordial pain: Secondary | ICD-10-CM | POA: Insufficient documentation

## 2024-08-28 DIAGNOSIS — E785 Hyperlipidemia, unspecified: Secondary | ICD-10-CM | POA: Insufficient documentation

## 2024-08-28 DIAGNOSIS — I700 Atherosclerosis of aorta: Secondary | ICD-10-CM | POA: Insufficient documentation

## 2024-08-28 DIAGNOSIS — I471 Supraventricular tachycardia, unspecified: Secondary | ICD-10-CM | POA: Insufficient documentation

## 2024-08-28 DIAGNOSIS — I4729 Other ventricular tachycardia: Secondary | ICD-10-CM | POA: Diagnosis not present

## 2024-08-28 DIAGNOSIS — R002 Palpitations: Secondary | ICD-10-CM | POA: Insufficient documentation

## 2024-08-28 DIAGNOSIS — E7800 Pure hypercholesterolemia, unspecified: Secondary | ICD-10-CM | POA: Insufficient documentation

## 2024-08-28 DIAGNOSIS — Z79899 Other long term (current) drug therapy: Secondary | ICD-10-CM | POA: Insufficient documentation

## 2024-08-28 LAB — ALT: ALT: 19 U/L (ref 0–44)

## 2024-08-28 LAB — LIPID PANEL
Cholesterol: 176 mg/dL (ref 0–200)
HDL: 51 mg/dL (ref >40)
LDL Cholesterol: 115 mg/dL — ABNORMAL HIGH (ref 0–99)
Total CHOL/HDL Ratio: 3.5 ratio
Triglycerides: 51 mg/dL (ref <150)
VLDL: 10 mg/dL (ref 0–40)

## 2024-08-28 LAB — AST: AST: 25 U/L (ref 15–41)

## 2024-08-28 MED ORDER — METOPROLOL TARTRATE 50 MG PO TABS: ORAL_TABLET | ORAL | 0 refills | Status: AC

## 2024-08-28 NOTE — Patient Instructions (Addendum)
 Medication Instructions:  Your physician recommends that you continue on your current medications as directed. Please refer to the Current Medication list given to you today.   *If you need a refill on your cardiac medications before your next appointment, please call your pharmacy*  Lab Work: None ordered at this time   Testing/Procedures: Your physician has requested that you have an echocardiogram. Echocardiography is a painless test that uses sound waves to create images of your heart. It provides your doctor with information about the size and shape of your heart and how well your heart's chambers and valves are working.   You may receive an ultrasound enhancing agent through an IV if needed to better visualize your heart during the echo. This procedure takes approximately one hour.  There are no restrictions for this procedure.  This will take place at [address] [facility]) [address], [place] [address]  Please note: We ask at that you not bring children with you during ultrasound (echo/ vascular) testing. Due to room size and safety concerns, children are not allowed in the ultrasound rooms during exams. Our front office staff cannot provide observation of children in our lobby area while testing is being conducted. An adult accompanying a patient to their appointment will only be allowed in the ultrasound room at the discretion of the ultrasound technician under special circumstances. We apologize for any inconvenience.   Your cardiac CT will be scheduled at one of the below locations:   [facility] [address] [place], [place] [address] [phone] (Severe contrast allergies only)  OR   [facility] [address] [place], [place] [address] [phone]  OR   [facility] [address] [place], [place] [address] [phone]  OR   [facility] [address]   [place], [place] [address]  OR   [facility] [address] [place], [place] [phone]  If scheduled at [facility], please arrive at the [facility] and Children's Entrance (Entrance C2) of [facility] 30 minutes prior to test start time. You can use the FREE valet parking offered at entrance C (encouraged to control the heart rate for the test)  Proceed to the [facility] Radiology Department (first floor) to check-in and test prep.  All radiology patients and guests should use entrance C2 at [facility], accessed from [facility], even though the hospital's physical address listed is [address]  If scheduled at the Heart and Vascular Tower at [address], please enter the parking lot using the [address] entrance and use the FREE valet service at the patient drop-off area. Enter the building and check-in with registration on the main floor.  If scheduled at [facility], please arrive to the Heart and Vascular Center 15 mins early for check-in and test prep.  There is spacious parking and easy access to the radiology department from the [facility] Heart and Vascular entrance. Please enter here and check-in with the desk attendant.   If scheduled at [facility], please arrive 30 minutes early for check-in and test prep.  Please follow these instructions carefully (unless otherwise directed):  An IV will be required for this test and Nitroglycerin  will be given.  Hold all erectile dysfunction medications at least 3 days (72 hrs) prior to test. (Ie viagra, cialis, sildenafil, tadalafil, etc)   On the Night Before the Test: Be sure  to Drink plenty of water. Do not consume any caffeinated/decaffeinated beverages or chocolate 12 hours prior to your test. Do not take any antihistamines 12 hours prior to your test.  On the Day of the Test: Drink plenty of water until 1 hour prior to the test. Do not eat any  food 1 hour prior to test. You may take your regular medications prior to the test.  Take metoprolol  (Lopressor ) two hours prior to test. If you take Furosemide/Hydrochlorothiazide/Spironolactone/Chlorthalidone, please HOLD on the morning of the test. Patients who wear a continuous glucose monitor MUST remove the device prior to scanning.      After the Test: Drink plenty of water. After receiving IV contrast, you may experience a mild flushed feeling. This is normal. On occasion, you may experience a mild rash up to 24 hours after the test. This is not dangerous. If this occurs, you can take Benadryl 25 mg, Zyrtec, Claritin, or Allegra and increase your fluid intake. (Patients taking Tikosyn should avoid Benadryl, and may take Zyrtec, Claritin, or Allegra) If you experience trouble breathing, this can be serious. If it is severe call 911 IMMEDIATELY. If it is mild, please call our office.  We will call to schedule your test 2-4 weeks out understanding that some insurance companies will need an authorization prior to the service being performed.   For more information and frequently asked questions, please visit our website : [email or web address]  For non-scheduling related questions, please contact the cardiac imaging nurse navigator should you have any questions/concerns: Cardiac Imaging Nurse Navigators Direct Office Dial: [phone]   For scheduling needs, including cancellations and rescheduling, please call Brittany, [phone].   Follow-Up: At [facility], you and your health needs are our priority.  As part of our continuing mission to provide you with exceptional heart care, our providers are all part of one team.  This team includes your primary Cardiologist (physician) and Advanced Practice Providers or APPs (Physician Assistants and Nurse Practitioners) who all work together to provide you with the care you need, when you need it.  Your next  appointment:   2 month(s)  Provider:   You may see Lonni Hanson, MD

## 2024-08-29 ENCOUNTER — Ambulatory Visit: Admitting: Physician Assistant

## 2024-08-29 LAB — LIPOPROTEIN A (LPA): Lipoprotein (a): 94.6 nmol/L — ABNORMAL HIGH (ref <75.0)

## 2024-09-19 ENCOUNTER — Ambulatory Visit: Admitting: Dermatology

## 2024-09-19 DIAGNOSIS — D1801 Hemangioma of skin and subcutaneous tissue: Secondary | ICD-10-CM | POA: Diagnosis not present

## 2024-09-19 DIAGNOSIS — R21 Rash and other nonspecific skin eruption: Secondary | ICD-10-CM

## 2024-09-19 DIAGNOSIS — L578 Other skin changes due to chronic exposure to nonionizing radiation: Secondary | ICD-10-CM | POA: Diagnosis not present

## 2024-09-19 DIAGNOSIS — L570 Actinic keratosis: Secondary | ICD-10-CM | POA: Diagnosis not present

## 2024-09-19 DIAGNOSIS — W908XXA Exposure to other nonionizing radiation, initial encounter: Secondary | ICD-10-CM

## 2024-09-19 DIAGNOSIS — D492 Neoplasm of unspecified behavior of bone, soft tissue, and skin: Secondary | ICD-10-CM | POA: Diagnosis not present

## 2024-09-19 DIAGNOSIS — L820 Inflamed seborrheic keratosis: Secondary | ICD-10-CM

## 2024-09-19 DIAGNOSIS — C44212 Basal cell carcinoma of skin of right ear and external auricular canal: Secondary | ICD-10-CM

## 2024-09-19 DIAGNOSIS — Z1283 Encounter for screening for malignant neoplasm of skin: Secondary | ICD-10-CM | POA: Diagnosis not present

## 2024-09-19 DIAGNOSIS — Z85828 Personal history of other malignant neoplasm of skin: Secondary | ICD-10-CM | POA: Diagnosis not present

## 2024-09-19 DIAGNOSIS — L814 Other melanin hyperpigmentation: Secondary | ICD-10-CM | POA: Diagnosis not present

## 2024-09-19 DIAGNOSIS — L821 Other seborrheic keratosis: Secondary | ICD-10-CM | POA: Diagnosis not present

## 2024-09-19 DIAGNOSIS — D229 Melanocytic nevi, unspecified: Secondary | ICD-10-CM

## 2024-09-19 MED ORDER — CLOBETASOL PROPIONATE 0.05 % EX CREA: 1.0000 | TOPICAL_CREAM | Freq: Two times a day (BID) | CUTANEOUS | 1 refills | Status: AC

## 2024-09-19 NOTE — Patient Instructions (Addendum)
 Wound Care Instructions  Cleanse wound gently with soap and water once a day then pat dry with clean gauze. Apply a thin coat of Petrolatum (petroleum jelly, Vaseline) over the wound (unless you have an allergy to this). We recommend that you use a new, sterile tube of Vaseline. Do not pick or remove scabs. Do not remove the yellow or white healing tissue from the base of the wound.  Cover the wound with fresh, clean, nonstick gauze and secure with paper tape. You may use Band-Aids in place of gauze and tape if the wound is small enough, but would recommend trimming much of the tape off as there is often too much. Sometimes Band-Aids can irritate the skin.  You should call the office for your biopsy report after 1 week if you have not already been contacted.  If you experience any problems, such as abnormal amounts of bleeding, swelling, significant bruising, significant pain, or evidence of infection, please call the office immediately.  FOR ADULT SURGERY PATIENTS: If you need something for pain relief you may take 1 extra strength Tylenol  (acetaminophen ) AND 2 Ibuprofen (200mg  each) together every 4 hours as needed for pain. (do not take these if you are allergic to them or if you have a reason you should not take them.) Typically, you may only need pain medication for 1 to 3 days.   Cryotherapy Aftercare  Wash gently with soap and water everyday.   Apply Vaseline and Band-Aid daily until healed.   For Precancers on Scalp Restart  5-fluorouracil/calcipotriene cream twice a day for 7 - 10 days to affected areas including scalp  Reviewed course of treatment and expected reaction.  Patient advised to expect inflammation and crusting and advised that erosions are possible.  Patient advised to be diligent with sun protection during and after treatment. Counseled to keep medication out of reach of children and pets.   Due to recent changes in healthcare laws, you may see results of your  pathology and/or laboratory studies on MyChart before the doctors have had a chance to review them. We understand that in some cases there may be results that are confusing or concerning to you. Please understand that not all results are received at the same time and often the doctors may need to interpret multiple results in order to provide you with the best plan of care or course of treatment. Therefore, we ask that you please give us  2 business days to thoroughly review all your results before contacting the office for clarification. Should we see a critical lab result, you will be contacted sooner.   If You Need Anything After Your Visit  If you have any questions or concerns for your doctor, please call our main line at [phone] and press option 4 to reach your doctor's medical assistant. If no one answers, please leave a voicemail as directed and we will return your call as soon as possible. Messages left after 4 pm will be answered the following business day.   You may also send us  a message via MyChart. We typically respond to MyChart messages within 1-2 business days.  For prescription refills, please ask your pharmacy to contact our office. Our fax number is [phone].  If you have an urgent issue when the clinic is closed that cannot wait until the next business day, you can page your doctor at the number below.    Please note that while we do our best to be available for urgent issues outside of  office hours, we are not available 24/7.   If you have an urgent issue and are unable to reach us , you may choose to seek medical care at your doctor's office, retail clinic, urgent care center, or emergency room.  If you have a medical emergency, please immediately call 911 or go to the emergency department.  Pager Numbers  - Dr. Hester: [phone]  - Dr. Jackquline: [phone]  - Dr. Claudene: [phone]   - Dr. Raymund: [phone]  In the event of inclement weather,  please call our main line at [phone] for an update on the status of any delays or closures.  Dermatology Medication Tips: Please keep the boxes that topical medications come in in order to help keep track of the instructions about where and how to use these. Pharmacies typically print the medication instructions only on the boxes and not directly on the medication tubes.   If your medication is too expensive, please contact our office at [phone] option 4 or send us  a message through MyChart.   We are unable to tell what your co-pay for medications will be in advance as this is different depending on your insurance coverage. However, we may be able to find a substitute medication at lower cost or fill out paperwork to get insurance to cover a needed medication.   If a prior authorization is required to get your medication covered by your insurance company, please allow us  1-2 business days to complete this process.  Drug prices often vary depending on where the prescription is filled and some pharmacies may offer cheaper prices.  The website www.goodrx.com contains coupons for medications through different pharmacies. The prices here do not account for what the cost may be with help from insurance (it may be cheaper with your insurance), but the website can give you the price if you did not use any insurance.  - You can print the associated coupon and take it with your prescription to the pharmacy.  - You may also stop by our office during regular business hours and pick up a GoodRx coupon card.  - If you need your prescription sent electronically to a different pharmacy, notify our office through [facility] or by phone at [phone] option 4.     Si Usted Necesita Algo Despus de Su Visita  Tambin puede enviarnos un mensaje a travs de Clinical Cytogeneticist. Por lo general respondemos a los mensajes de MyChart en el transcurso de 1 a 2 das hbiles.  Para renovar recetas, por favor  pida a su farmacia que se ponga en contacto con nuestra oficina. Randi lakes de fax es [place] [phone].  Si tiene un asunto urgente cuando la clnica est cerrada y que no puede esperar hasta el siguiente da hbil, puede llamar/localizar a su doctor(a) al nmero que aparece a continuacin.   Por favor, tenga en cuenta que aunque hacemos todo lo posible para estar disponibles para asuntos urgentes fuera del horario de [place], no estamos disponibles las 24 horas del da, los 7 [address]  [phone] de la [place].   Si tiene un problema urgente y no puede comunicarse con nosotros, puede optar por buscar atencin mdica  en el consultorio de su doctor(a), en una clnica privada, en un centro de atencin urgente o en una sala de emergencias.  Si tiene engineer, drilling, por favor llame inmediatamente al 911 o vaya a la sala de emergencias.  Nmeros de bper  - Dr. Hester: [phone]  - Dra. Jackquline: [phone]  - Dr. Claudene:  [phone]  - Dra. Kitts: [phone]  En caso de inclemencias del [place], por favor llame a nuestra lnea principal al [phone] para una actualizacin sobre el estado de cualquier retraso o cierre.  Consejos para la medicacin en dermatologa: Por favor, guarde las cajas en las que vienen los medicamentos de uso tpico para ayudarle a seguir las instrucciones sobre dnde y cmo usarlos. Las farmacias generalmente imprimen las instrucciones del medicamento slo en las cajas y no directamente en los tubos del [place].   Si su medicamento es muy caro, por favor, pngase en contacto con landry rieger llamando al [phone] y presione la opcin 4 o envenos un mensaje a travs de Clinical Cytogeneticist.   No podemos decirle cul ser su copago por los medicamentos por adelantado ya que esto es diferente dependiendo de la cobertura de su seguro. Sin embargo, es posible que podamos encontrar un medicamento sustituto a audiological scientist un formulario para que el seguro cubra el  medicamento que se considera necesario.   Si se requiere una autorizacin previa para que su compaa de seguros cubra su medicamento, por favor permtanos de 1 a 2 das hbiles para completar este proceso.  Los precios de los medicamentos varan con frecuencia dependiendo del environmental consultant de dnde se surte la receta y alguna farmacias pueden ofrecer precios ms baratos.  El sitio web www.goodrx.com tiene cupones para medicamentos de health and safety inspector. Los precios aqu no tienen en cuenta lo que podra costar con la ayuda del seguro (puede ser ms barato con su seguro), pero el sitio web puede darle el precio si no utiliz tourist information centre manager.  - Puede imprimir el cupn correspondiente y llevarlo con su receta a la farmacia.  - Tambin puede pasar por nuestra oficina durante el horario de atencin regular y education officer, museum una tarjeta de cupones de GoodRx.  - Si necesita que su receta se enve electrnicamente a una farmacia diferente, informe a nuestra oficina a travs de MyChart de [place] o por telfono llamando al [phone] y presione la opcin 4.

## 2024-09-19 NOTE — Progress Notes (Signed)
 Follow-Up Visit   Subjective  Andrew [name] is a 78 y.o. male who presents for the following: Skin Cancer Screening and Upper Body Skin Exam  hx of BCC, Aks, used 5FU/Calcipotriene to scalp forehead and temples since last visit with mild reaction, check irritated spots forehead, R cheek, check red spot L lower leg, has had for months, crusty spot on ear- h/o BCC in same area treated with EDC.   The patient presents for Upper Body Skin Exam (UBSE) for skin cancer screening and mole check. The patient has spots, moles and lesions to be evaluated, some may be new or changing and the patient may have concern these could be cancer.    The following portions of the chart were reviewed this encounter and updated as appropriate: medications, allergies, medical history  Review of Systems:  No other skin or systemic complaints except as noted in HPI or Assessment and Plan.  Objective  Well appearing patient in no apparent distress; mood and affect are within normal limits.  All skin waist up and left lower leg examined. Relevant physical exam findings are noted in the Assessment and Plan.  R antehelix 7.11mm shiny pink flesh macule with crusting  R forehead x 1, R cheek x 2 (3) Stuck on waxy paps with erythema Scalp x 5 (5) Pink scaly macules  L medial calf      Assessment & Plan  Skin cancer screening performed today.  ACTINIC DAMAGE WITH PRECANCEROUS ACTINIC KERATOSES Counseling for Topical Chemotherapy Management: Patient exhibits: - Severe, confluent actinic changes with pre-cancerous actinic keratoses that is secondary to cumulative UV radiation exposure over time - Condition that is severe; chronic, not at goal. - diffuse scaly erythematous macules and papules with underlying dyspigmentation - Discussed Prescription Field Treatment topical Chemotherapy for Severe, Chronic Confluent Actinic Changes with Pre-Cancerous Actinic Keratoses Field treatment involves treatment of  an entire area of skin that has confluent Actinic Changes (Sun/ Ultraviolet light damage) and PreCancerous Actinic Keratoses by method of PhotoDynamic Therapy (PDT) and/or prescription Topical Chemotherapy agents such as 5-fluorouracil, 5-fluorouracil/calcipotriene, and/or imiquimod.  The purpose is to decrease the number of clinically evident and subclinical PreCancerous lesions to prevent progression to development of skin cancer by chemically destroying early precancer changes that may or may not be visible.  It has been shown to reduce the risk of developing skin cancer in the treated area. As a result of treatment, redness, scaling, crusting, and open sores may occur during treatment course. One or more than one of these methods may be used and may have to be used several times to control, suppress and eliminate the PreCancerous changes. Discussed treatment course, expected reaction, and possible side effects. - Recommend daily broad spectrum sunscreen SPF 30+ to sun-exposed areas, reapply every 2 hours as needed.  - Staying in the shade or wearing long sleeves, sun glasses (UVA+UVB protection) and wide brim hats (4-inch brim around the entire circumference of the hat) are also recommended. - Call for new or changing lesions.  - Restart  5-fluorouracil/calcipotriene cream twice a day for 7 - 10 days to affected areas including scalp. Prescription sent to [facility]. Patient advised they will receive an email to purchase the medication online and have it sent to their home. Patient provided with handout reviewing treatment course and side effects and advised to call or message us  on MyChart with any concerns.  Reviewed course of treatment and expected reaction.  Patient advised to expect inflammation and crusting  and advised that erosions are possible.  Patient advised to be diligent with sun protection during and after treatment. Counseled to keep medication out of reach of  children and pets.    Lentigines, Seborrheic Keratoses, Hemangiomas - Benign normal skin lesions - Benign-appearing - Call for any changes  Melanocytic Nevi - Tan-brown and/or pink-flesh-colored symmetric macules and papules - Benign appearing on exam today - Observation - Call clinic for new or changing moles - Recommend daily use of broad spectrum spf 30+ sunscreen to sun-exposed areas.   HISTORY OF BASAL CELL CARCINOMA OF THE SKIN - No evidence of recurrence today - Recommend regular full body skin exams - Recommend daily broad spectrum sunscreen SPF 30+ to sun-exposed areas, reapply every 2 hours as needed.  - Call if any new or changing lesions are noted between office visits  - R temple, R upper third ear helix  Nummular Dermatitis vs NLD vs Granulomatous Bite Reaction L medial calf Exam: Pink orange indurated smooth plaque with peripheral scale, photo today  no h/o DM   Nummular dermatitis (eczema) is a chronic, relapsing, itchy rash that can significantly affect quality of life. It is often associated with dry skin and flares in the wintertime, and may require treatment with prescription topical anti-inflammatory medications, in addition to gentle skin care.  If there is associated atopic dermatitis and topicals are not working, then biologic injections may be necessary to clear rash and control symptoms.  Treatment Plan: Start Clobetasol cr bid until clear, avoid f/g/a Recheck on f/up, if not improving, may biopsy Recommend mild soap and moisturizing cream 1-2 times daily.  Gentle skin care handout provided.    NEOPLASM OF SKIN R antehelix Skin / nail biopsy Type of biopsy: tangential   Informed consent: discussed and consent obtained   Anesthesia: the lesion was anesthetized in a standard fashion   Anesthesia comment:  Area prepped with alcohol Anesthetic:  1% lidocaine  w/ epinephrine  1-100,000 buffered w/ 8.4% NaHCO3 Instrument used: flexible razor blade    Hemostasis achieved with: pressure, aluminum chloride and electrodesiccation   Outcome: patient tolerated procedure well   Post-procedure details: wound care instructions given   Post-procedure details comment:  Ointment and small bandage applied  Specimen 1 - Surgical pathology Differential Diagnosis: AK r/o BCC  Check Margins: No 7.76mm shiny pink flesh macule with crusting Discussed if skin cancer will recommend mohs with Dr. Paci INFLAMED SEBORRHEIC KERATOSIS (3) R forehead x 1, R cheek x 2 (3) Symptomatic, irritating, patient would like treated. Destruction of lesion - R forehead x 1, R cheek x 2 (3)  Destruction method: cryotherapy   Informed consent: discussed and consent obtained   Lesion destroyed using liquid nitrogen: Yes   Region frozen until ice ball extended beyond lesion: Yes   Outcome: patient tolerated procedure well with no complications   Post-procedure details: wound care instructions given   Additional details:  Prior to procedure, discussed risks of blister formation, small wound, skin dyspigmentation, or rare scar following cryotherapy. Recommend Vaseline ointment to treated areas while healing.   AK (ACTINIC KERATOSIS) (5) Scalp x 5 (5) Actinic keratoses are precancerous spots that appear secondary to cumulative UV radiation exposure/sun exposure over time. They are chronic with expected duration over 1 year. A portion of actinic keratoses will progress to squamous cell carcinoma of the skin. It is not possible to reliably predict which spots will progress to skin cancer and so treatment is recommended to prevent development of skin cancer.  Recommend daily  broad spectrum sunscreen SPF 30+ to sun-exposed areas, reapply every 2 hours as needed.  Recommend staying in the shade or wearing long sleeves, sun glasses (UVA+UVB protection) and wide brim hats (4-inch brim around the entire circumference of the hat). Call for new or changing lesions. Destruction of  lesion - Scalp x 5 (5)  Destruction method: cryotherapy   Informed consent: discussed and consent obtained   Lesion destroyed using liquid nitrogen: Yes   Region frozen until ice ball extended beyond lesion: Yes   Outcome: patient tolerated procedure well with no complications   Post-procedure details: wound care instructions given   Additional details:  Prior to procedure, discussed risks of blister formation, small wound, skin dyspigmentation, or rare scar following cryotherapy. Recommend Vaseline ointment to treated areas while healing.   RASH AND NONSPECIFIC SKIN ERUPTION   SKIN CANCER SCREENING   ACTINIC SKIN DAMAGE   LENTIGO   SEBORRHEIC KERATOSIS   HEMANGIOMA OF SKIN   NEVUS   HISTORY OF BASAL CELL CARCINOMA (BCC) OF SKIN    Return in about 2 months (around 11/20/2024) for Recheck L calf, 69m recheck R ear, AK f/u.  I, Grayce Saunas, RMA, am acting as scribe for Rexene Rattler, MD .   Documentation: I have reviewed the above documentation for accuracy and completeness, and I agree with the above.  Rexene Rattler, MD

## 2024-09-21 ENCOUNTER — Telehealth (HOSPITAL_COMMUNITY): Payer: Self-pay | Admitting: *Deleted

## 2024-09-21 LAB — SURGICAL PATHOLOGY

## 2024-09-21 NOTE — Telephone Encounter (Signed)
 Reaching out to patient to offer assistance regarding upcoming cardiac imaging study; pt verbalizes understanding of appt date/time, parking situation and where to check in, pre-test NPO status and medications ordered, and verified current allergies; name and call back number provided for further questions should they arise Sid Seats RN Navigator Cardiac Imaging [facility] [phone] office [phone] cell

## 2024-09-24 ENCOUNTER — Ambulatory Visit: Payer: Self-pay | Admitting: Dermatology

## 2024-09-24 ENCOUNTER — Ambulatory Visit
Admission: RE | Admit: 2024-09-24 | Discharge: 2024-09-24 | Disposition: A | Source: Ambulatory Visit | Attending: Physician Assistant | Admitting: Physician Assistant

## 2024-09-24 ENCOUNTER — Encounter: Payer: Self-pay | Admitting: Dermatology

## 2024-09-24 DIAGNOSIS — C44219 Basal cell carcinoma of skin of left ear and external auricular canal: Secondary | ICD-10-CM

## 2024-09-24 DIAGNOSIS — R072 Precordial pain: Secondary | ICD-10-CM

## 2024-09-24 MED ORDER — METOPROLOL TARTRATE 5 MG/5ML IV SOLN: 10.0000 mg | Freq: Once | INTRAVENOUS | Status: DC | PRN

## 2024-09-24 MED ORDER — NITROGLYCERIN 0.4 MG SL SUBL
0.8000 mg | SUBLINGUAL_TABLET | Freq: Once | SUBLINGUAL | Status: AC
  Administered 2024-09-24: 11:00:00 0.8 mg via SUBLINGUAL
  Filled 2024-09-24: qty 25

## 2024-09-24 MED ORDER — IOHEXOL 350 MG/ML SOLN
100.0000 mL | Freq: Once | INTRAVENOUS | Status: AC | PRN
  Administered 2024-09-24: 11:00:00 100 mL via INTRAVENOUS

## 2024-09-24 MED ORDER — DILTIAZEM HCL 25 MG/5ML IV SOLN: 10.0000 mg | INTRAVENOUS | Status: DC | PRN

## 2024-09-24 NOTE — Addendum Note (Signed)
 Addended by: TERESA PALMA R on: 09/24/2024 04:40 PM   Modules accepted: Orders

## 2024-09-24 NOTE — Telephone Encounter (Signed)
-----   Message from Rexene Rattler, MD sent at 09/24/2024  9:52 AM EST ----- 1. Skin, right antehelix :       BASAL CELL CARCINOMA, NODULAR PATTERN   BCC skin cancer (recurrent)- recommend Mohs surgery with Dr. Corey - please call patient

## 2024-09-24 NOTE — Telephone Encounter (Signed)
 Patient advised of BX results and referral sent to Dr. Corey. aw

## 2024-09-24 NOTE — Telephone Encounter (Signed)
 Left pt msg to call for bx result/sh

## 2024-09-24 NOTE — Progress Notes (Signed)
 Patient tolerated procedure well. Ambulate w/o difficulty. Denies any lightheadedness or being dizzy. Pt denies any pain at this time. Sitting in chair. Pt is encouraged to drink additional water throughout the day and reason explained to patient. Patient verbalized understanding and all questions answered. ABC intact. No further needs at this time. Discharge from procedure area w/o issues.

## 2024-09-25 ENCOUNTER — Ambulatory Visit: Payer: Self-pay | Admitting: Physician Assistant

## 2024-09-27 ENCOUNTER — Ambulatory Visit

## 2024-09-27 DIAGNOSIS — R072 Precordial pain: Secondary | ICD-10-CM | POA: Diagnosis present

## 2024-09-27 LAB — ECHOCARDIOGRAM COMPLETE
AR max vel: 4.35 cm2
AV Area VTI: 3.91 cm2
AV Area mean vel: 4.09 cm2
AV Mean grad: 4 mmHg
AV Peak grad: 6.9 mmHg
Ao pk vel: 1.31 m/s
Area-P 1/2: 5.97 cm2
S' Lateral: 3.4 cm
Single Plane A4C EF: 54.2 %

## 2024-10-17 ENCOUNTER — Ambulatory Visit

## 2024-11-05 ENCOUNTER — Encounter: Admitting: Dermatology

## 2024-11-07 ENCOUNTER — Ambulatory Visit: Attending: Internal Medicine | Admitting: Internal Medicine

## 2024-11-07 ENCOUNTER — Encounter: Payer: Self-pay | Admitting: Internal Medicine

## 2024-11-07 VITALS — BP 116/76 | HR 82 | Ht 72.0 in | Wt 155.4 lb

## 2024-11-07 DIAGNOSIS — I700 Atherosclerosis of aorta: Secondary | ICD-10-CM | POA: Insufficient documentation

## 2024-11-07 DIAGNOSIS — R072 Precordial pain: Secondary | ICD-10-CM | POA: Insufficient documentation

## 2024-11-07 DIAGNOSIS — R002 Palpitations: Secondary | ICD-10-CM | POA: Diagnosis not present

## 2024-11-07 DIAGNOSIS — E785 Hyperlipidemia, unspecified: Secondary | ICD-10-CM | POA: Insufficient documentation

## 2024-11-07 DIAGNOSIS — I471 Supraventricular tachycardia, unspecified: Secondary | ICD-10-CM | POA: Diagnosis not present

## 2024-11-07 DIAGNOSIS — I4729 Other ventricular tachycardia: Secondary | ICD-10-CM | POA: Insufficient documentation

## 2024-11-07 NOTE — Progress Notes (Signed)
 " Cardiology Office Note:  .   Date:  11/07/2024  ID:  Andrew [name], DOB 06/29/1946, MRN [id number] PCP: Donzella Lauraine SAILOR, DO  [facility] Providers Cardiologist:  Lonni Hanson, MD     History of Present Illness: .   Andrew [name] is a 79 y.o. male with history of atypical chest pain with normal coronary CTA (02/2020 and 09/2024), palpitations with monitor showing brief runs of PSVT and NSVT, aortic atherosclerosis, hiatal hernia, and anxiety, who presents for follow-up of chest pain.  He was seen in mid November by Bernardino Bring, PA, following ED visit 5 days earlier for chest pain.  ED workup had been unrevealing.  He continued to note sporadic cramping discomfort in the left side of his chest not associated with exertion.  He was referred for an echocardiogram and repeat coronary CTA, which were notable for normal LVEF with mild MR, mild-moderate TR, and mild AI as well as absence of CAD.  Incidental note was made of a moderate to large hiatal hernia.  Today, Andrew [name] reports that he has been feeling fairly well.  He has not had any further episodes of frank chest pain but still notices occasional flutters in his chest.  He has not had any shortness of breath or lightheadedness.  His wife is concerned because Andrew [name] often has cold feet.  He also notes some numbness in his fingers when they become cold.  He wonders if propranolol  could be contributing to this.  He has mild dependent edema in both legs, improved since he stopped wearing such tight socks.  He also has a skin nodule along the left calf that has not resolved with OTC ointments.  He is scheduled for dermatologic evaluation.  Andrew [name] notes that his younger brother died from sudden cardiac death this summer.  He had not had any known cardiac disease; Andrew [name] reports that the physicians caring for him did not believe it was a heart attack that caused his death.  ROS: See HPI  Studies Reviewed: SABRA       TTE  (09/27/2024): Normal LV size and wall thickness.  LVEF 60-65% with normal wall motion and diastolic parameters.  Normal RV size and function.  Upper normal pulmonary artery pressure.  Severe left atrial enlargement.  No pericardial effusion.  Mild mitral and aortic regurgitation.  Mild-moderate tricuspid regurgitation.  Normal CVP.  Cardiac CTA (09/24/2024): Normal coronary arteries without plaque, stenosis, or calcification.  Stable moderate-large hiatal hernia and hepatic cyst noted.  Risk Assessment/Calculations:             Physical Exam:   VS:  BP 116/76 (BP Location: Left Arm, Patient Position: Sitting, Cuff Size: Normal)   Pulse 82   Ht 6' (1.829 m)   Wt 155 lb 6.4 oz (70.5 kg)   SpO2 99%   BMI 21.08 kg/m    Wt Readings from Last 3 Encounters:  11/07/24 155 lb 6.4 oz (70.5 kg)  08/28/24 160 lb (72.6 kg)  08/23/24 150 lb (68 kg)    General:  NAD. Neck: No JVD or HJR. Lungs: Clear to auscultation bilaterally without wheezes or crackles. Heart: Regular rate and rhythm without murmurs, rubs, or gallops. Abdomen: Soft, nontender, nondistended. Extremities: Left calf erythematous patch.  Trace pretibial edema bilaterally.  ASSESSMENT AND PLAN: .    Chest pain: No frank chest pain noted since last visit.  Coronary CTA last month was reassuring without any evidence of coronary artery disease.  Moderate-large  hiatal hernia was redemonstrated; query if this could be contributing to some episodes of his chest discomfort.  No further cardiac workup recommended at this time.  Palpitations, PSVT, and NSVT: Sporadic palpitations noted without any worrisome associated symptoms.  Prior event monitor showed brief runs of PSVT and NSVT.  We reviewed his recent echocardiogram that was notable for normal LVEF with severe left atrial enlargement but only mild mitral and aortic regurgitation as well as mild-moderate TR overall similar to last echo.  We will defer additional testing for now though  repeat echocardiogram in 1 to 2 years should be considered to exclude worsening valvular heart disease.  I suggest that he consider a wearable device such as an Apple Watch to monitor for PAF, given that his left atrial enlargement places him at increased risk for this.  We will plan to continue propranolol  as long as his cold fingers/feet and associated paresthesias do not worsen.  He is on propranolol  as well given history of essential tremor.  Aortic atherosclerosis and hyperlipidemia: Aortic atherosclerosis noted on cross-sectional imaging.  No evidence of CAD based on coronary CTA.  Most recent lipid panel in November notable for LDL of 115 and LP(a) of 95.  Recommend continued lifestyle modifications to help lower cardiovascular risk.    Dispo: Return to clinic in 1 year.  Signed, Lonni Hanson, MD  "

## 2024-11-07 NOTE — Patient Instructions (Signed)

## 2024-11-14 ENCOUNTER — Ambulatory Visit

## 2024-11-14 VITALS — BP 110/60 | Ht 71.0 in | Wt 156.0 lb

## 2024-11-14 DIAGNOSIS — Z0000 Encounter for general adult medical examination without abnormal findings: Secondary | ICD-10-CM

## 2024-11-14 NOTE — Progress Notes (Cosign Needed)
 "  Chief Complaint  Patient presents with   Medicare Wellness     Subjective:   Andrew [name] is a 79 y.o. male who presents for a Medicare Annual Wellness Visit.  Visit info / Clinical Intake: Medicare Wellness Visit Type:: Subsequent Annual Wellness Visit Persons participating in visit and providing information:: patient Medicare Wellness Visit Mode:: In-person (required for WTM) Interpreter Needed?: No Pre-visit prep was completed: yes AWV questionnaire completed by patient prior to visit?: yes Date:: 11/13/24 Living arrangements:: (Patient-Rptd) lives with spouse/significant other Patient's Overall Health Status Rating: (Patient-Rptd) very good Typical amount of pain: (Patient-Rptd) none Does pain affect daily life?: (Patient-Rptd) no Are you currently prescribed opioids?: no  Dietary Habits and Nutritional Risks How many meals a day?: (Patient-Rptd) 3 Eats fruit and vegetables daily?: (Patient-Rptd) yes Most meals are obtained by: (Patient-Rptd) preparing own meals In the last 2 weeks, have you had any of the following?: none Diabetic:: no  Functional Status Activities of Daily Living (to include ambulation/medication): (Patient-Rptd) Independent Ambulation: (Patient-Rptd) Independent Medication Administration: (Patient-Rptd) Independent Home Management (perform basic housework or laundry): (Patient-Rptd) Independent Manage your own finances?: (Patient-Rptd) yes Primary transportation is: (Patient-Rptd) driving Concerns about vision?: no *vision screening is required for WTM* (GLASSES ALL DAY- MD IN [place] - YEARLY) Concerns about hearing?: (Patient-Rptd) no  Fall Screening Falls in the past year?: (Patient-Rptd) 1 Number of falls in past year: (Patient-Rptd) 0 Was there an injury with Fall?: (Patient-Rptd) 0 Fall Risk Category Calculator: (Patient-Rptd) 1 Patient Fall Risk Level: (Patient-Rptd) Low Fall Risk  Fall Risk Patient at Risk for Falls Due to: History  of fall(s) Fall risk Follow up: Falls evaluation completed; Falls prevention discussed  Home and Transportation Safety: All rugs have non-skid backing?: (!) (Patient-Rptd) no All stairs or steps have railings?: (Patient-Rptd) yes Grab bars in the bathtub or shower?: (!) (Patient-Rptd) no Have non-skid surface in bathtub or shower?: (Patient-Rptd) yes Good home lighting?: (Patient-Rptd) yes Regular seat belt use?: (Patient-Rptd) yes Hospital stays in the last year:: (Patient-Rptd) no  Cognitive Assessment Difficulty concentrating, remembering, or making decisions? : (Patient-Rptd) no Will 6CIT or Mini Cog be Completed: yes What year is it?: 0 points What month is it?: 0 points Give patient an address phrase to remember (5 components): [address] [place], [place] About what time is it?: 0 points Count backwards from 20 to 1: 0 points Say the months of the year in reverse: 0 points Repeat the address phrase from earlier: 0 points 6 CIT Score: 0 points  Advance Directives (For Healthcare) Does Patient Have a Medical Advance Directive?: Yes Does patient want to make changes to medical advance directive?: No - Patient declined Type of Advance Directive: Healthcare Power of Gay; Living will Copy of Healthcare Power of Attorney in Chart?: Yes - validated most recent copy scanned in chart (See row information) Copy of Living Will in Chart?: Yes - validated most recent copy scanned in chart (See row information)  Reviewed/Updated  Reviewed/Updated: Reviewed All (Medical, Surgical, Family, Medications, Allergies, Care Teams, Patient Goals)    Allergies (verified) Patient has no known allergies.   Current Medications (verified) Outpatient Encounter Medications as of 11/14/2024  Medication Sig   cholecalciferol (VITAMIN D) 1000 UNITS tablet Take 1,000 Units by mouth daily.   clobetasol  cream (TEMOVATE ) 0.05 % Apply 1 Application topically 2 (two) times daily. Bid aa left lower leg  until clear, avoid face, groin, axilla   MAGNESIUM PO Take 1 tablet by mouth daily. occasionally   Multiple  Vitamin (MULTI-VITAMIN DAILY) TABS Take 1 tablet by mouth daily.    propranolol  ER (INDERAL  LA) 80 MG 24 hr capsule TAKE 1 CAPSULE BY MOUTH EVERY DAY   saw palmetto 160 MG capsule Take 160 mg by mouth every other day.    Turmeric 450 MG CAPS Take 450 mg by mouth daily. occasionally   metoprolol  tartrate (LOPRESSOR ) 50 MG tablet TAKE 1 TABLET 2 HR PRIOR TO CARDIAC PROCEDURE (Patient not taking: Reported on 11/07/2024)   No facility-administered encounter medications on file as of 11/14/2024.    History: Past Medical History:  Diagnosis Date   Actinic keratosis    Aortic atherosclerosis 02/15/2020   on CT scan   Atypical chest pain    a. Rest pain only; b. 08/2016 ETT: Ex time , no ST/T changes ->nl study.   Basal cell carcinoma 08/11/2015   Right temple. Superficial and nodular patterns. Excised 09/02/2015, margins free.   Basal cell carcinoma 05/16/2018   Right upper third ear helix. Nodular pattern. txd with [facility]   Basal cell carcinoma 09/19/2024   R antehelix, needs mohs with Dr. Corey   Headache    H/O MIGRAINES   Heart murmur    History of anxiety    Hypertension    NSVT (nonsustained ventricular tachycardia) (HCC)    a. 03/2017 Zio Monitor: 2 episodes of NSVT - up to 7 beats, max HR 207.   Palpitations    a. 03/2017 14 day Zio Monitor: PACs, PVCs, brief PSVT and NSVT-->managed w/ beta blocker.   Precancerous lesion    PSVT (paroxysmal supraventricular tachycardia)    a. 03/2017  Zio monitor: 7 episodes of PSVT - up to 6 beats, max rate 193.   Valvular heart disease    a. 06/2017 Echo: EF 60-65%. No rwma. Mild MR. Mild to mod TR. PASP .   Past Surgical History:  Procedure Laterality Date   EYE SURGERY Right    Done as a child   HERNIA REPAIR     INGUINAL HERNIA REPAIR Right 03/20/2018   Large Ultrapro mesh.  Surgeon: Dessa Reyes ORN, MD;  Location: [facility]  ORS;  Service: General;  Laterality: Right;   VASECTOMY     30 yrs ago?   Family History  Problem Relation Age of Onset   COPD Mother    Cancer Mother    Asthma Mother    Heart attack Father    Hypertension Father    Stroke Father    Hyperlipidemia Father    Heart disease Father    Stroke Maternal Grandfather    Hearing loss Maternal Grandfather    Early death Maternal Grandfather    Social History   Occupational History   Occupation: Retired  Tobacco Use   Smoking status: Never   Smokeless tobacco: Never  Vaping Use   Vaping status: Never Used  Substance and Sexual Activity   Alcohol use: Yes    Alcohol/week: 1.0 standard drink of alcohol    Types: 1 Glasses of wine per week    Comment: 1 glass of wine per week   Drug use: Never   Sexual activity: Not Currently    Comment: vasectomy   Tobacco Counseling Counseling given: Not Answered  SDOH Screenings   Food Insecurity: No Food Insecurity (11/14/2024)  Housing: Low Risk (11/14/2024)  Transportation Needs: No Transportation Needs (11/14/2024)  Utilities: Not At Risk (11/14/2024)  Alcohol Screen: Low Risk (11/13/2024)  Depression (PHQ2-9): Low Risk (11/14/2024)  Financial Resource Strain: Low Risk (11/13/2024)  Physical  Activity: Insufficiently Active (11/14/2024)  Social Connections: Moderately Integrated (11/14/2024)  Stress: No Stress Concern Present (11/13/2024)  Tobacco Use: Low Risk (11/14/2024)  Health Literacy: Adequate Health Literacy (11/14/2024)   See flowsheets for full screening details  Depression Screen PHQ 2 & 9 Depression Scale- Over the past 2 weeks, how often have you been bothered by any of the following problems? Little interest or pleasure in doing things: 0 Feeling down, depressed, or hopeless (PHQ Adolescent also includes...irritable): 0 PHQ-2 Total Score: 0 Trouble falling or staying asleep, or sleeping too much: 0 Feeling tired or having little energy: 0 Poor appetite or overeating (PHQ Adolescent also  includes...weight loss): 0 Feeling bad about yourself - or that you are a failure or have let yourself or your family down: 0 Trouble concentrating on things, such as reading the newspaper or watching television (PHQ Adolescent also includes...like school work): 0 Moving or speaking so slowly that other people could have noticed. Or the opposite - being so fidgety or restless that you have been moving around a lot more than usual: 0 Thoughts that you would be better off dead, or of hurting yourself in some way: 0 PHQ-9 Total Score: 0 If you checked off any problems, how difficult have these problems made it for you to do your work, take care of things at home, or get along with other people?: Not difficult at all  Depression Treatment Depression Interventions/Treatment : [id number] Score <4 Follow-up Not Indicated     Goals Addressed             This Visit's Progress    DIET - EAT MORE FRUITS AND VEGETABLES               Objective:    Today's Vitals   11/14/24 1559  BP: 110/60  Weight: 156 lb (70.8 kg)  Height: 5' 11 (1.803 m)   Body mass index is 21.76 kg/m.  Hearing/Vision screen No results found. Immunizations and Health Maintenance Health Maintenance  Topic Date Due   COVID-19 Vaccine (5 - Pfizer risk 2025-26 season) 12/25/2024   Medicare Annual Wellness (AWV)  11/14/2025   DTaP/Tdap/Td (2 - Td or Tdap) 08/10/2027   Pneumococcal Vaccine: 50+ Years  Completed   Influenza Vaccine  Completed   Hepatitis C Screening  Completed   Zoster Vaccines- Shingrix  Completed   Meningococcal B Vaccine  Aged Out   Colonoscopy  Discontinued   Fecal DNA (Cologuard)  Discontinued        Assessment/Plan:  This is a routine wellness examination for Tamir.  Patient Care Team: Donzella Lauraine SAILOR, DO as PCP - General (Family Medicine) End, Lonni, MD as PCP - Cardiology (Cardiology) Vivienne Lonni Ingle, NP as Nurse Practitioner (Nurse Practitioner) Jackquline Sawyer, MD as  Consulting Physician (Dermatology)  I have personally reviewed and noted the following in the patients chart:   Medical and social history Use of alcohol, tobacco or illicit drugs  Current medications and supplements including opioid prescriptions. Functional ability and status Nutritional status Physical activity Advanced directives List of other physicians Hospitalizations, surgeries, and ER visits in previous 12 months Vitals Screenings to include cognitive, depression, and falls Referrals and appointments  No orders of the defined types were placed in this encounter.  In addition, I have reviewed and discussed with patient certain preventive protocols, quality metrics, and best practice recommendations. A written personalized care plan for preventive services as well as general preventive health recommendations were provided to patient.   Marymargaret Kirker S  Gwenn, LPN   04/14/7972   Return in about 1 year (around 11/14/2025).  After Visit Summary: (In Person-Printed) AVS printed and given to the patient  Nurse Notes: UTD ON SHOTS; AGED OUT OF COLONOSCOPY/COLOGUARD  No voiced or noted concerns at this time  "

## 2024-11-14 NOTE — Patient Instructions (Addendum)
 Andrew [name],  Thank you for taking the time for your Medicare Wellness Visit. I appreciate your continued commitment to your health goals. Please review the care plan we discussed, and feel free to reach out if I can assist you further.  Please note that Annual Wellness Visits do not include a physical exam. Some assessments may be limited, especially if the visit was conducted virtually. If needed, we may recommend an in-person follow-up with your provider.  Ongoing Care Seeing your primary care provider every 3 to 6 months helps us  monitor your health and provide consistent, personalized care.   Referrals If a referral was made during today's visit and you haven't received any updates within two weeks, please contact the referred provider directly to check on the status.  Recommended Screenings:  Health Maintenance  Topic Date Due   Medicare Annual Wellness Visit  04/26/2024   COVID-19 Vaccine (5 - Pfizer risk 2025-26 season) 12/25/2024   DTaP/Tdap/Td vaccine (2 - Td or Tdap) 08/10/2027   Pneumococcal Vaccine for age over 27  Completed   Flu Shot  Completed   Hepatitis C Screening  Completed   Zoster (Shingles) Vaccine  Completed   Meningitis B Vaccine  Aged Out   Colon Cancer Screening  Discontinued   Cologuard (Stool DNA test)  Discontinued       11/13/2024    8:42 AM  Advanced Directives  Does Patient Have a Medical Advance Directive? Yes  Type of Estate Agent of [place];Living will  Does patient want to make changes to medical advance directive? No - Patient declined  Copy of Healthcare Power of Attorney in Chart? Yes - validated most recent copy scanned in chart (See row information)    Vision: Annual vision screenings are recommended for early detection of glaucoma, cataracts, and diabetic retinopathy. These exams can also reveal signs of chronic conditions such as diabetes and high blood pressure.  Dental: Annual dental screenings help detect  early signs of oral cancer, gum disease, and other conditions linked to overall health, including heart disease and diabetes.  Please see the attached documents for additional preventive care recommendations.   NEXT AWV  11/20/25 @ 3:50 PM IN PERSON

## 2024-11-27 ENCOUNTER — Ambulatory Visit: Admitting: Dermatology

## 2024-11-28 ENCOUNTER — Encounter: Admitting: Dermatology

## 2025-04-09 ENCOUNTER — Ambulatory Visit: Admitting: Dermatology

## 2025-08-27 ENCOUNTER — Encounter: Admitting: Dermatology

## 2025-11-20 ENCOUNTER — Ambulatory Visit
